# Patient Record
Sex: Female | Born: 1975 | Hispanic: Yes | Marital: Married | Smoking: Never smoker
Health system: Southern US, Community
[De-identification: ages and names within clinical notes are randomized; demographics above are authoritative.]

## PROBLEM LIST (undated history)

## (undated) DIAGNOSIS — A6923 Arthritis due to Lyme disease: Secondary | ICD-10-CM

## (undated) DIAGNOSIS — M1120 Other chondrocalcinosis, unspecified site: Secondary | ICD-10-CM

## (undated) DIAGNOSIS — D259 Leiomyoma of uterus, unspecified: Secondary | ICD-10-CM

## (undated) DIAGNOSIS — Z8619 Personal history of other infectious and parasitic diseases: Secondary | ICD-10-CM

## (undated) DIAGNOSIS — R200 Anesthesia of skin: Secondary | ICD-10-CM

## (undated) DIAGNOSIS — K219 Gastro-esophageal reflux disease without esophagitis: Secondary | ICD-10-CM

## (undated) DIAGNOSIS — M2550 Pain in unspecified joint: Secondary | ICD-10-CM

## (undated) HISTORY — DX: Anesthesia of skin: R20.0

## (undated) HISTORY — DX: Gastro-esophageal reflux disease without esophagitis: K21.9

## (undated) HISTORY — DX: Leiomyoma of uterus, unspecified: D25.9

## (undated) HISTORY — PX: BREAST SURGERY: SHX581

## (undated) HISTORY — DX: Other chondrocalcinosis, unspecified site: M11.20

## (undated) HISTORY — DX: Pain in unspecified joint: M25.50

## (undated) HISTORY — PX: COLON SURGERY: SHX602

## (undated) HISTORY — DX: Personal history of other infectious and parasitic diseases: Z86.19

## (undated) HISTORY — DX: Arthritis due to Lyme disease: A69.23

## (undated) HISTORY — PX: TUBAL LIGATION: SHX77

---

## 2001-12-08 ENCOUNTER — Encounter: Admission: RE | Admit: 2001-12-08 | Discharge: 2001-12-08 | Payer: Self-pay | Admitting: Specialist

## 2001-12-08 ENCOUNTER — Encounter: Payer: Self-pay | Admitting: Specialist

## 2018-03-05 DIAGNOSIS — N939 Abnormal uterine and vaginal bleeding, unspecified: Secondary | ICD-10-CM | POA: Insufficient documentation

## 2018-03-05 DIAGNOSIS — R87619 Unspecified abnormal cytological findings in specimens from cervix uteri: Secondary | ICD-10-CM | POA: Insufficient documentation

## 2018-03-05 HISTORY — DX: Abnormal uterine and vaginal bleeding, unspecified: N93.9

## 2018-03-05 HISTORY — DX: Unspecified abnormal cytological findings in specimens from cervix uteri: R87.619

## 2018-05-19 ENCOUNTER — Encounter: Payer: Self-pay | Admitting: Internal Medicine

## 2018-05-19 ENCOUNTER — Other Ambulatory Visit: Payer: Self-pay

## 2018-05-19 ENCOUNTER — Ambulatory Visit (INDEPENDENT_AMBULATORY_CARE_PROVIDER_SITE_OTHER): Payer: Commercial Managed Care - PPO | Admitting: Internal Medicine

## 2018-05-19 DIAGNOSIS — M5431 Sciatica, right side: Secondary | ICD-10-CM | POA: Insufficient documentation

## 2018-05-19 DIAGNOSIS — M1990 Unspecified osteoarthritis, unspecified site: Secondary | ICD-10-CM | POA: Insufficient documentation

## 2018-05-19 HISTORY — DX: Sciatica, right side: M54.31

## 2018-05-19 HISTORY — DX: Unspecified osteoarthritis, unspecified site: M19.90

## 2018-05-19 NOTE — Progress Notes (Signed)
[facility]      Reason for Consult: history of Lyme disease    Referring Physician: Dr. Aris Everts    Patient ID: Lisa Rivas, female    DOB: 1975-05-05, 43 y.o.   MRN: [id number]  HPI:   Here for evaluation of above.   She gives a history of being diagnosed with Lyme disease in 2012 by her primary care doctor when she presented with aches and joint complaints.  She states she had a positive blood test for Lyme disease at the time and took 3 weeks of doxycycline.  She remembers completing the medication and symptoms resolved.  She then had a follow up blood test by her report and the Lyme test was negative.  No record of this available to me.  She had not been to a Lyme-endemic area, had no EM rash, no meningitis, was not associated with a viral illness- like syndrome and resolved.  She then felt she had been fine since that time but recently has had more right shoulder pain, right leg pain and pain of her right foot.  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  She has been wearing heavy steel-toes boots as well.  She was sent here by Dr. Lin Landsman at her PCPs office with these complaints and to consider Lyme disease.  She gets relief of her symptoms with arthritis medication and had relief of her right leg with a steroid injection.   She was concerned of having to be treated for Lyme disease and heard about prolonged IV therapies and multiple medications people undergo for "chronic Lyme disease". Previous record reviewed in [place].  Saw rheumatology in 2016 and considered RA.    PMH: Hx of Lyme disease; hx of uterine fibroid  Prior to Admission medications   Not on File    No Known Allergies  Social History   Tobacco Use  . Smoking status: Never Smoker  . Smokeless tobacco: Never Used  Substance Use Topics  . Alcohol use: Not on file  . Drug use: Not on file  Married    [facility]: arthritis  Review of Systems  Constitutional: negative for fevers, chills, anorexia and weight loss Gastrointestinal: negative for nausea and diarrhea Musculoskeletal: negative for large joint edema Neurological: negative for headaches and dizziness Behavioral/Psych: negative for anxiety All other systems reviewed and are negative    Constitutional: in no apparent distress and alert  EYES: anicteric ENMT: no thrush Cardiovascular: Cor RRR Respiratory: CTA B; normal respiratory effort Musculoskeletal: full ROM of shoulder, hip, knee; no joint swelling Skin: negatives: no rash Neuro: non-focal  Labs: No results found for: WBC, HGB, HCT, MCV, PLT No results found for: CREATININE, BUN, NA, K, CL, CO2 No results found for: ALT, AST, GGT, ALKPHOS, BILITOT, INR   Assessment: 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  I discussed Lyme disease and its natural history, diagnosis, symptoms and treatment.  I am not clear why she was diagnosed with Lyme disease in 2012 since I do not have records but by her report she had no symptoms to suggest Lyme disease and had not been in an endemic area.  Additionally, she reportedly had convalescent serum that was negative suggesting she never had it.  Her only symptoms at the time were non-specific.  I assured her that she does not have any evidence for Lyme disease and long-term treatments  regardless are baseless and have no role in patients with confirmed Lyme disease.      By history, her should sounds like an overuse issue with her work, potentially her foot and right leg pain exacerbated by heavy steel-toed shoes.  It is improved with rest and anti-inflammatory medication and she may have an element of underlying arthritis (?rheumatoid, osteoarthritis).  She may have sciatica and plantar fasciitis due to her work and I discussed stretching exercises for this.  I will defer further work up for arthritis to her PCP.  Plan: 1)  continue supportive care  Thank you for the referral All questions answered to her satisfaction

## 2019-03-10 DIAGNOSIS — N854 Malposition of uterus: Secondary | ICD-10-CM | POA: Insufficient documentation

## 2019-08-20 ENCOUNTER — Encounter: Payer: Self-pay | Admitting: Gastroenterology

## 2019-08-21 ENCOUNTER — Encounter: Payer: Self-pay | Admitting: Internal Medicine

## 2019-08-21 ENCOUNTER — Encounter: Payer: Self-pay | Admitting: Gastroenterology

## 2019-10-06 ENCOUNTER — Encounter: Payer: Self-pay | Admitting: *Deleted

## 2019-10-07 ENCOUNTER — Ambulatory Visit: Payer: 59 | Admitting: Diagnostic Neuroimaging

## 2019-10-14 ENCOUNTER — Ambulatory Visit: Payer: Commercial Managed Care - PPO | Admitting: Gastroenterology

## 2019-12-11 ENCOUNTER — Ambulatory Visit: Payer: Self-pay | Admitting: Gastroenterology

## 2019-12-14 ENCOUNTER — Encounter: Payer: Self-pay | Admitting: Diagnostic Neuroimaging

## 2019-12-14 ENCOUNTER — Ambulatory Visit: Payer: 59 | Admitting: Diagnostic Neuroimaging

## 2019-12-14 VITALS — BP 112/75 | HR 78 | Ht 68.0 in | Wt 218.8 lb

## 2019-12-14 DIAGNOSIS — R531 Weakness: Secondary | ICD-10-CM | POA: Diagnosis not present

## 2019-12-14 DIAGNOSIS — R519 Headache, unspecified: Secondary | ICD-10-CM | POA: Diagnosis not present

## 2019-12-14 NOTE — Progress Notes (Signed)
[facility]  PATIENT: Lisa Rivas DOB: [birth year]  REFERRING CLINICIAN: Ronita Hipps, MD HISTORY FROM: patient and husband  REASON FOR VISIT: new consult    HISTORICAL  CHIEF COMPLAINT:  Chief Complaint  Patient presents with  . Facial numbness    rm 7 New Pt husband- Kittie Plater "not sure she has Lyme disease, numbness on left side of face and left arm/hand when I lay on that side"    HISTORY OF PRESENT ILLNESS:   44 year old female here for evaluation of left facial pain and numbness.  Symptoms started 6 months ago.  She describes intermittent pain and numbness in the left side of her face.  Symptoms can last minutes or hours at a time.  Sometimes her left arm falls asleep.  Sometimes she has left leg pain and numbness.  Attacks happen at least 2 times per week.  Patient taken Tylenol or Motrin with mild relief.  Patient has chronic arthritis pain.  She was previously evaluated by rheumatology.  She was previously diagnosed with Lyme disease in 2012 when she was living in [place] in [place].  Patient also had concussion at age 60 years old when she tripped and hit her head.  Patient also had Covid infection in November 2020.   REVIEW OF SYSTEMS: Full 14 system review of systems performed and negative with exception of: As per HPI.  ALLERGIES: No Known Allergies  HOME MEDICATIONS: Outpatient Medications Prior to Visit  Medication Sig Dispense Refill  . Cholecalciferol (D-3-5) 125 MCG (5000 UT) capsule Take 5,000 Units by mouth daily.    Marland Kitchen UNABLE TO FIND Med Name: probiotics     No facility-administered medications prior to visit.    PAST MEDICAL HISTORY: Past Medical History:  Diagnosis Date  . Acid reflux   . Facial numbness   . Fibroid uterus    h/o  . History of Lyme disease   . Joint pain     PAST SURGICAL HISTORY: Past Surgical History:  Procedure Laterality Date  . BREAST SURGERY    . COLON SURGERY    . TUBAL LIGATION       FAMILY HISTORY: No family history on file.  SOCIAL HISTORY: Social History   Socioeconomic History  . Marital status: Married    Spouse name: [place]  . Number of children: 3  . Years of education: HS in [place]  . Highest education level: Not on file  Occupational History  . Not on file  Tobacco Use  . Smoking status: Never Smoker  . Smokeless tobacco: Never Used  Substance and Sexual Activity  . Alcohol use: Not Currently  . Drug use: Not Currently  . Sexual activity: Not on file  Other Topics Concern  . Not on file  Social History Narrative   Lives with family   Caffeine coffee, 1 c daily   Social Determinants of Health   Financial Resource Strain:   . Difficulty of Paying Living Expenses: Not on file  Food Insecurity:   . Worried About Charity fundraiser in the Last Year: Not on file  . Ran Out of Food in the Last Year: Not on file  Transportation Needs:   . Lack of Transportation (Medical): Not on file  . Lack of Transportation (Non-Medical): Not on file  Physical Activity:   . Days of Exercise per Week: Not on file  . Minutes of Exercise per Session: Not on file  Stress:   . Feeling of Stress : Not on file  Social Connections:   . Frequency of Communication with Friends and Family: Not on file  . Frequency of Social Gatherings with Friends and Family: Not on file  . Attends Religious Services: Not on file  . Active Member of Clubs or Organizations: Not on file  . Attends Archivist Meetings: Not on file  . Marital Status: Not on file  Intimate Partner Violence:   . Fear of Current or Ex-Partner: Not on file  . Emotionally Abused: Not on file  . Physically Abused: Not on file  . Sexually Abused: Not on file     PHYSICAL EXAM  GENERAL EXAM/CONSTITUTIONAL: Vitals:  Vitals:   12/14/19 1118  BP: 112/75  Pulse: 78  Weight: 218 lb 12.8 oz (99.2 kg)  Height: 5\' 8"  (1.727 m)     Body mass index is 33.27 kg/m. Wt Readings from Last 3  Encounters:  12/14/19 218 lb 12.8 oz (99.2 kg)  05/19/18 204 lb (92.5 kg)     Patient is in no distress; well developed, nourished and groomed; neck is supple  CARDIOVASCULAR:  Examination of carotid arteries is normal; no carotid bruits  Regular rate and rhythm, no murmurs  Examination of peripheral vascular system by observation and palpation is normal  EYES:  Ophthalmoscopic exam of optic discs and posterior segments is normal; no papilledema or hemorrhages  No exam data present  MUSCULOSKELETAL:  Gait, strength, tone, movements noted in Neurologic exam below  NEUROLOGIC: MENTAL STATUS:  No flowsheet data found.  awake, alert, oriented to person, place and time  recent and remote memory intact  normal attention and concentration  language fluent, comprehension intact, naming intact  fund of knowledge appropriate  CRANIAL NERVE:   2nd - no papilledema on fundoscopic exam  2nd, 3rd, 4th, 6th - pupils equal and reactive to light, visual fields full to confrontation, extraocular muscles intact, no nystagmus  5th - facial sensation symmetric  7th - facial strength symmetric  8th - hearing intact  9th - palate elevates symmetrically, uvula midline  11th - shoulder shrug symmetric  12th - tongue protrusion midline  MOTOR:   normal bulk and tone, full strength in the BUE, BLE; EXCEPT DECR IN LEFT ARM AND LEFT LEG (FLUCTUATING)  SENSORY:   normal and symmetric to light touch, temperature, vibration  COORDINATION:   finger-nose-finger, fine finger movements normal  REFLEXES:   deep tendon reflexes present and symmetric  GAIT/STATION:   narrow based gait     DIAGNOSTIC DATA (LABS, IMAGING, TESTING) - I reviewed patient records, labs, notes, testing and imaging myself where available.  No results found for: WBC, HGB, HCT, MCV, PLT No results found for: NA, K, CL, CO2, GLUCOSE, BUN, CREATININE, CALCIUM, PROT, ALBUMIN, AST, ALT, ALKPHOS,  BILITOT, GFRNONAA, GFRAA No results found for: CHOL, HDL, LDLCALC, LDLDIRECT, TRIG, CHOLHDL No results found for: HGBA1C No results found for: VITAMINB12 No results found for: TSH     ASSESSMENT AND PLAN  44 y.o. year old female here with intermittent pain and numbness and weakness on left face, arm, leg, for past 6 months (summer 2021).   Dx:  1. Left-sided weakness   2. Left temporal headache      PLAN:  - check MRI brain with and without contrast; rule out vascular, demyelinating, autoimmune, inflammatory disease  Orders Placed This Encounter  Procedures  . MR BRAIN W WO CONTRAST   Return for pending if symptoms worsen or fail to improve.    Penni Bombard, MD  03/10/3359, 22:44 AM Certified in Neurology, Neurophysiology and [facility] [address], [place], [place] [address] [phone]

## 2019-12-15 ENCOUNTER — Telehealth: Payer: Self-pay | Admitting: Diagnostic Neuroimaging

## 2019-12-15 NOTE — Telephone Encounter (Signed)
[employer] Josem Kaufmann: [id number] (exp. 12/15/19 to 01/29/20) order sent to GI . They will reach out to the patient to schedule.

## 2020-01-04 ENCOUNTER — Ambulatory Visit
Admission: RE | Admit: 2020-01-04 | Discharge: 2020-01-04 | Disposition: A | Discharge status: Home / Self Care | Payer: 59 | Source: Ambulatory Visit | Attending: Diagnostic Neuroimaging | Admitting: Diagnostic Neuroimaging

## 2020-01-04 ENCOUNTER — Other Ambulatory Visit: Payer: Self-pay

## 2020-01-04 DIAGNOSIS — R531 Weakness: Secondary | ICD-10-CM

## 2020-01-04 DIAGNOSIS — R519 Headache, unspecified: Secondary | ICD-10-CM

## 2020-01-04 MED ORDER — GADOBENATE DIMEGLUMINE 529 MG/ML IV SOLN
20.0000 mL | Freq: Once | INTRAVENOUS | Status: AC | PRN
  Administered 2020-01-04: 20 mL via INTRAVENOUS

## 2020-01-11 ENCOUNTER — Telehealth: Payer: Self-pay | Admitting: *Deleted

## 2020-01-11 NOTE — Telephone Encounter (Signed)
LVM requesting call back for MRI results. 

## 2020-01-11 NOTE — Telephone Encounter (Signed)
Patient returned call and informed MRI brain was a slightly abnormal study (small spot; possible prior inflammation). Dr Cletis Athens recommends addional testing (MRI cervical and thoracic spine). Then may consider add'l lab testing. Advised she'll get a call after insurance approval is done. Patient verbalized understanding, appreciation.

## 2020-01-14 ENCOUNTER — Telehealth: Payer: Self-pay | Admitting: *Deleted

## 2020-01-14 DIAGNOSIS — R519 Headache, unspecified: Secondary | ICD-10-CM

## 2020-01-14 DIAGNOSIS — R9089 Other abnormal findings on diagnostic imaging of central nervous system: Secondary | ICD-10-CM

## 2020-01-14 DIAGNOSIS — R531 Weakness: Secondary | ICD-10-CM

## 2020-01-14 NOTE — Telephone Encounter (Signed)
Called patient and informed her MRI brain results showed a small abnormal lesion; Dr Leta Baptist stated she needs addional workup (MRI cervical and thoracic spine (with and without contrast).  Answered her questions to her stated satisfaction. She agrees to additional MRI scans, understands she'll get a call to schedule them after insurance approves,  verbalized understanding, appreciation. MRI orders placed.

## 2020-01-18 ENCOUNTER — Telehealth: Payer: Self-pay | Admitting: Diagnostic Neuroimaging

## 2020-01-18 NOTE — Telephone Encounter (Signed)
[employer] auth for the Cervical spine [id number] (exp. 01/18/20 to 03/03/20)  MRI Thoracic is pending. I uploaded notes.   Patient is scheduled at GI for 02/10/19.

## 2020-01-21 NOTE — Telephone Encounter (Signed)
[facility] for Thoracic: [phone] (exp. 01/21/20 to 03/06/20)

## 2020-02-10 ENCOUNTER — Ambulatory Visit
Admission: RE | Admit: 2020-02-10 | Discharge: 2020-02-10 | Disposition: A | Discharge status: Home / Self Care | Payer: 59 | Source: Ambulatory Visit | Attending: Diagnostic Neuroimaging | Admitting: Diagnostic Neuroimaging

## 2020-02-10 ENCOUNTER — Other Ambulatory Visit: Payer: Self-pay

## 2020-02-10 DIAGNOSIS — R519 Headache, unspecified: Secondary | ICD-10-CM

## 2020-02-10 DIAGNOSIS — R531 Weakness: Secondary | ICD-10-CM | POA: Diagnosis not present

## 2020-02-10 DIAGNOSIS — R9089 Other abnormal findings on diagnostic imaging of central nervous system: Secondary | ICD-10-CM

## 2020-02-10 MED ORDER — GADOBENATE DIMEGLUMINE 529 MG/ML IV SOLN
20.0000 mL | Freq: Once | INTRAVENOUS | Status: AC | PRN
  Administered 2020-02-10: 20 mL via INTRAVENOUS

## 2020-03-03 ENCOUNTER — Telehealth: Payer: Self-pay | Admitting: Diagnostic Neuroimaging

## 2020-03-03 NOTE — Telephone Encounter (Signed)
Called husband and LVM advising her MRI cervical spine is normal and shows stable brian lesion. Her  MRI thoracic spine is normal.  Left # for questions.

## 2020-03-03 NOTE — Telephone Encounter (Signed)
Pt's husband called wanting to know when the MRI results for 02/10/20 will be coming in. Please advise.

## 2020-06-08 ENCOUNTER — Encounter: Payer: Self-pay | Admitting: Nurse Practitioner

## 2020-06-13 ENCOUNTER — Emergency Department (HOSPITAL_COMMUNITY): Payer: 59

## 2020-06-13 ENCOUNTER — Other Ambulatory Visit: Payer: Self-pay

## 2020-06-13 ENCOUNTER — Emergency Department (HOSPITAL_COMMUNITY)
Admission: EM | Admit: 2020-06-13 | Discharge: 2020-06-13 | Disposition: A | Discharge status: Home / Self Care | Payer: 59 | Attending: Emergency Medicine | Admitting: Emergency Medicine

## 2020-06-13 DIAGNOSIS — R1013 Epigastric pain: Secondary | ICD-10-CM | POA: Diagnosis present

## 2020-06-13 DIAGNOSIS — R110 Nausea: Secondary | ICD-10-CM | POA: Insufficient documentation

## 2020-06-13 DIAGNOSIS — K219 Gastro-esophageal reflux disease without esophagitis: Secondary | ICD-10-CM | POA: Diagnosis not present

## 2020-06-13 LAB — COMPREHENSIVE METABOLIC PANEL
ALT: 18 U/L (ref 0–44)
AST: 19 U/L (ref 15–41)
Albumin: 4 g/dL (ref 3.5–5.0)
Alkaline Phosphatase: 74 U/L (ref 38–126)
Anion gap: 7 (ref 5–15)
BUN: 9 mg/dL (ref 6–20)
CO2: 22 mmol/L (ref 22–32)
Calcium: 8.8 mg/dL — ABNORMAL LOW (ref 8.9–10.3)
Chloride: 107 mmol/L (ref 98–111)
Creatinine, Ser: 0.56 mg/dL (ref 0.44–1.00)
GFR, Estimated: >60 mL/min (ref >60)
Glucose, Bld: 90 mg/dL (ref 70–99)
Potassium: 3.8 mmol/L (ref 3.5–5.1)
Sodium: 136 mmol/L (ref 135–145)
Total Bilirubin: 0.3 mg/dL (ref 0.3–1.2)
Total Protein: 6.9 g/dL (ref 6.5–8.1)

## 2020-06-13 LAB — CBC
HCT: 40.7 % (ref 36.0–46.0)
Hemoglobin: 13.5 g/dL (ref 12.0–15.0)
MCH: 30.5 pg (ref 26.0–34.0)
MCHC: 33.2 g/dL (ref 30.0–36.0)
MCV: 91.9 fL (ref 80.0–100.0)
Platelets: 267 10*3/uL (ref 150–400)
RBC: 4.43 MIL/uL (ref 3.87–5.11)
RDW: 12.8 % (ref 11.5–15.5)
WBC: 5.6 10*3/uL (ref 4.0–10.5)
nRBC: 0 % (ref 0.0–0.2)

## 2020-06-13 LAB — URINALYSIS, ROUTINE W REFLEX MICROSCOPIC
Bilirubin Urine: NEGATIVE
Glucose, UA: NEGATIVE mg/dL
Hgb urine dipstick: NEGATIVE
Ketones, ur: NEGATIVE mg/dL
Nitrite: NEGATIVE
Protein, ur: NEGATIVE mg/dL
Specific Gravity, Urine: 1.01 (ref 1.005–1.030)
pH: 8 (ref 5.0–8.0)

## 2020-06-13 LAB — I-STAT BETA HCG BLOOD, ED (MC, WL, AP ONLY): I-stat hCG, quantitative: <5 m[IU]/mL (ref <5)

## 2020-06-13 LAB — LIPASE, BLOOD: Lipase: 31 U/L (ref 11–51)

## 2020-06-13 MED ORDER — ALUMINUM-MAGNESIUM-SIMETHICONE 200-200-20 MG/5ML PO SUSP: 30.0000 mL | Freq: Three times a day (TID) | ORAL | 0 refills | Status: DC

## 2020-06-13 MED ORDER — MORPHINE SULFATE (PF) 4 MG/ML IV SOLN
4.0000 mg | Freq: Once | INTRAVENOUS | Status: AC
  Administered 2020-06-13: 4 mg via INTRAVENOUS
  Filled 2020-06-13: qty 1

## 2020-06-13 MED ORDER — ALUM & MAG HYDROXIDE-SIMETH 200-200-20 MG/5ML PO SUSP
30.0000 mL | Freq: Once | ORAL | Status: AC
  Administered 2020-06-13: 30 mL via ORAL
  Filled 2020-06-13: qty 30

## 2020-06-13 MED ORDER — LIDOCAINE VISCOUS HCL 2 % MT SOLN
15.0000 mL | Freq: Once | OROMUCOSAL | Status: AC
  Administered 2020-06-13: 15 mL via ORAL
  Filled 2020-06-13: qty 15

## 2020-06-13 MED ORDER — AMIODARONE HCL IN DEXTROSE 360-4.14 MG/200ML-% IV SOLN
INTRAVENOUS | Status: AC
  Filled 2020-06-13: qty 200

## 2020-06-13 MED ORDER — ONDANSETRON HCL 4 MG/2ML IJ SOLN
4.0000 mg | Freq: Once | INTRAMUSCULAR | Status: AC
  Administered 2020-06-13: 4 mg via INTRAVENOUS
  Filled 2020-06-13: qty 2

## 2020-06-13 MED ORDER — SUCRALFATE 1 G PO TABS: 1.0000 g | ORAL_TABLET | Freq: Three times a day (TID) | ORAL | 0 refills | Status: DC

## 2020-06-13 NOTE — ED Provider Notes (Signed)
[facility] Provider Note   CSN: [id number] Arrival date & time: 06/13/20  7510     History Chief Complaint  Patient presents with  . Abdominal Pain    Lisa Rivas is a 45 y.o. female with past medical history significant for acid reflux presents for evaluation of epigastric pain.  Patient states pain has been constant x1 month however worse over the last 2 days.  She was started on PPI by her PCP.  Has had some intermittent nausea without emesis.  Does not radiate into her back.  She denies fever, chills, nausea, vomiting, chest pain, shortness of breath, hemoptysis, right upper quadrant pain, dysuria, hematuria, melena, bright blood per rectum, diarrhea or constipation.  She denies any additional aggravating or alleviating factors.  She is not currently followed by GI provider.  Pain is worse with food intake.  States she has stopped drinking caffeine as well as greasy foods.  She denies any prior history of abdominal surgeries.  Denies additional aggravating or alleviating factors. Rate pain a 6/10. Described pain as burning.  History obtained from patient past medical records.  [place] interpreter, request to use husband as interpreter  HPI     Past Medical History:  Diagnosis Date  . Acid reflux   . Facial numbness   . Fibroid uterus    h/o  . History of Lyme disease   . Joint pain     Patient Active Problem List   Diagnosis Date Noted  . Sciatica of right side 05/19/2018  . Arthritis 05/19/2018    Past Surgical History:  Procedure Laterality Date  . BREAST SURGERY    . COLON SURGERY    . TUBAL LIGATION       OB History   No obstetric history on file.     No family history on file.  Social History   Tobacco Use  . Smoking status: Never Smoker  . Smokeless tobacco: Never Used  Substance Use Topics  . Alcohol use: Not Currently  . Drug use: Not Currently    Home Medications Prior to Admission  medications   Medication Sig Start Date End Date Taking? Authorizing Provider  aluminum-magnesium hydroxide-simethicone (MAALOX) [phone] MG/5ML SUSP Take 30 mLs by mouth 4 (four) times daily -  before meals and at bedtime. 06/13/20  Yes Selda Jalbert A, PA-C  sucralfate (CARAFATE) 1 g tablet Take 1 tablet (1 g total) by mouth 4 (four) times daily -  with meals and at bedtime. 06/13/20 07/13/20 Yes Rimsha Trembley A, PA-C  Cholecalciferol (D-3-5) 125 MCG (5000 UT) capsule Take 5,000 Units by mouth daily.    [provider]  UNABLE TO FIND Med Name: probiotics    [provider]    Allergies    Patient has no known allergies.  Review of Systems   Review of Systems  Constitutional: Negative.   HENT: Negative.   Respiratory: Negative.   Cardiovascular: Negative.   Gastrointestinal: Positive for abdominal pain and nausea. Negative for abdominal distention, anal bleeding, blood in stool, constipation, diarrhea, rectal pain and vomiting.  Genitourinary: Negative.   Musculoskeletal: Negative.   Skin: Negative.   Neurological: Negative.   All other systems reviewed and are negative.   Physical Exam Updated Vital Signs BP 111/68   Pulse 67   Temp 98.9 F (37.2 C) (Oral)   Resp 18   Ht 5\' 8"  (1.727 m)   Wt 99 kg   SpO2 98%   BMI 33.19  kg/m   Physical Exam Vitals and nursing note reviewed.  Constitutional:      General: She is not in acute distress.    Appearance: She is well-developed. She is not ill-appearing, toxic-appearing or diaphoretic.  HENT:     Head: Normocephalic and atraumatic.     Mouth/Throat:     Mouth: Mucous membranes are moist.  Eyes:     Pupils: Pupils are equal, round, and reactive to light.  Cardiovascular:     Rate and Rhythm: Normal rate.     Heart sounds: Normal heart sounds.  Pulmonary:     Effort: Pulmonary effort is normal. No respiratory distress.     Breath sounds: Normal breath sounds.     Comments: Speaks in full sentences  without difficulty.  Lungs clear to auscultation bilaterally. Abdominal:     General: Bowel sounds are normal. There is no distension.     Palpations: Abdomen is soft.     Tenderness: There is abdominal tenderness in the epigastric area. There is no right CVA tenderness, left CVA tenderness, guarding or rebound. Negative signs include Murphy's sign and McBurney's sign.     Hernia: No hernia is present.     Comments: Soft, tenderness epigastric region.  No right upper quadrant tenderness.  Negative Murphy sign, and Burney point.  Normoactive bowel sounds.  No overlying skin changes to abdominal wall.  Musculoskeletal:        General: Normal range of motion.     Cervical back: Normal range of motion.  Skin:    General: Skin is warm and dry.     Capillary Refill: Capillary refill takes less than 2 seconds.  Neurological:     General: No focal deficit present.     Mental Status: She is alert and oriented to person, place, and time.     ED Results / Procedures / Treatments   Labs (all labs ordered are listed, but only abnormal results are displayed) Labs Reviewed  COMPREHENSIVE METABOLIC PANEL - Abnormal; Notable for the following components:      Result Value   Calcium 8.8 (*)    All other components within normal limits  URINALYSIS, ROUTINE W REFLEX MICROSCOPIC - Abnormal; Notable for the following components:   APPearance HAZY (*)    Leukocytes,Ua SMALL (*)    Bacteria, UA FEW (*)    All other components within normal limits  LIPASE, BLOOD  CBC  I-STAT BETA HCG BLOOD, ED ([facility], [facility], [facility] ONLY)    EKG EKG Interpretation  Date/Time:  Monday Jun 13 2020 09:20:17 EDT Ventricular Rate:  82 PR Interval:  142 QRS Duration: 72 QT Interval:  372 QTC Calculation: 434 R Axis:   41 Text Interpretation: Normal sinus rhythm Low voltage QRS Borderline ECG No previous ECGs available Confirmed by Theotis Burrow [phone]) on 06/13/2020 11:58:39 AM   Radiology US Abdomen Limited RUQ  (LIVER/GB)  Result Date: 06/13/2020 CLINICAL DATA:  One month of abdominal pain EXAM: ULTRASOUND ABDOMEN LIMITED RIGHT UPPER QUADRANT COMPARISON:  Jun 16, 2019. FINDINGS: Gallbladder: No gallstones or pericholecystic fluid visualized. No sonographic Murphy sign noted by sonographer. Common bile duct: Diameter: 2.5 Liver: No focal lesion identified. Diffusely increased parenchymal echogenicity. Portal vein is patent on color Doppler imaging with normal direction of blood flow towards the liver. Other: None. IMPRESSION: The echogenicity of the liver is increased. This is a nonspecific finding but is most commonly seen with fatty infiltration of the liver. There are no obvious focal liver lesions identified. Electronically Signed  By: Dahlia Bailiff MD   On: 06/13/2020 14:02    Procedures Procedures   Medications Ordered in ED Medications  alum & mag hydroxide-simeth (MAALOX/MYLANTA) 200-200-20 MG/5ML suspension 30 mL (30 mLs Oral Given 06/13/20 1106)    And  lidocaine (XYLOCAINE) 2 % viscous mouth solution 15 mL (15 mLs Oral Given 06/13/20 1106)  morphine 4 MG/ML injection 4 mg (4 mg Intravenous Given 06/13/20 1359)  ondansetron (ZOFRAN) injection 4 mg (4 mg Intravenous Given 06/13/20 1358)    ED Course  I have reviewed the triage vital signs and the nursing notes.  Pertinent labs & imaging results that were available during my care of the patient were reviewed by me and considered in my medical decision making (see chart for details).  45 year old here for evaluation epigastric pain which is been ongoing x1 month.  She is afebrile, nonseptic, non-ill-appearing.  Describes pain as burning.  Worse with food intake.  Placed on PPI by PCP.  Has had nausea without emesis.  Heart and lungs are clear.  Her abdomen is tender to epigastric region however no upper right quadrant tenderness, negative Murphy sign.  She denies any NSAID use, EtOH use, history of pancreatitis or ulcers.  Denies any blood per rectum  or any melanotic stool.  No chest pain, shortness of breath to suggest atypical cardiac etiology.  Without tachycardia, tachypnea or hypoxia.  We will plan on labs, GI cocktail and reassess.  Labs and imaging personally reviewed and interpreted:  CBC without leukocytosis Preg negative CMP without electrolyte, renal or liver abnormality Lipase 31 UA negative for infection EKG without ischemic changes  Patient reassessed. Pain improved, however still has pain.  Is pointing out to the epigastric is also mild right upper quadrant tenderness however negative Murphy sign.  We will plan on ultrasound  Ultrasound likely fatty liver however no gallstones, evidence of acute cholecystitis. Tolerating PO intake without difficulty.  Patient symptoms likely due to reflux.  I have added Carafate, Maalox to her PPI that she is already taking.  I did review computer she has appointment in less than 2 weeks to see [facility].  Repeat benign abdominal exam I have low suspicion for perforated viscus at this time.  Patient is nontoxic, nonseptic appearing, in no apparent distress.  Patient's pain and other symptoms adequately managed in emergency department.  Fluid bolus given.  Labs, imaging and vitals reviewed.  Patient does not meet the SIRS or Sepsis criteria.  On repeat exam patient does not have a surgical abdomin and there are no peritoneal signs.  No indication of appendicitis, bowel obstruction, bowel perforation, cholecystitis, diverticulitis.  The patient has been appropriately medically screened and/or stabilized in the ED. I have low suspicion for any other emergent medical condition which would require further screening, evaluation or treatment in the ED or require inpatient management.  Patient is hemodynamically stable and in no acute distress.  Patient able to ambulate in department prior to ED.  Evaluation does not show acute pathology that would require ongoing or additional emergent interventions  while in the emergency department or further inpatient treatment.  I have discussed the diagnosis with the patient and answered all questions.  Pain is been managed while in the emergency department and patient has no further complaints prior to discharge.  Patient is comfortable with plan discussed in room and is stable for discharge at this time.  I have discussed strict return precautions for returning to the emergency department.  Patient was encouraged to  follow-up with PCP/specialist refer to at discharge.      MDM Rules/Calculators/A&P                           Final Clinical Impression(s) / ED Diagnoses Final diagnoses:  Epigastric abdominal pain    Rx / DC Orders ED Discharge Orders         Ordered    sucralfate (CARAFATE) 1 g tablet  3 times daily with meals & bedtime        06/13/20 1423    aluminum-magnesium hydroxide-simethicone (MAALOX) [phone] MG/5ML SUSP  3 times daily before meals & bedtime        06/13/20 1423           Idona Stach A, PA-C 06/13/20 1432    Little, Wenda Overland, MD 06/13/20 2027

## 2020-06-13 NOTE — ED Notes (Signed)
Pt transported to Ultrasound.  

## 2020-06-13 NOTE — Discharge Instructions (Signed)
Tome el medicamento segn lo prescrito.  Seguimiento con Financial risk analyst por sntomas nuevos o que [employer]

## 2020-06-13 NOTE — ED Notes (Signed)
Pt returned from ultrasound

## 2020-06-13 NOTE — ED Triage Notes (Signed)
Pt presents to the ED with epigastric pain for a month. Pt has an appointment at the end of the month is having too severe of epigastric pain. Denies nausea or vomiting.

## 2020-06-29 ENCOUNTER — Ambulatory Visit (INDEPENDENT_AMBULATORY_CARE_PROVIDER_SITE_OTHER): Payer: 59 | Admitting: Nurse Practitioner

## 2020-06-29 ENCOUNTER — Encounter: Payer: Self-pay | Admitting: Nurse Practitioner

## 2020-06-29 VITALS — BP 112/72 | HR 58 | Ht 68.0 in | Wt 200.0 lb

## 2020-06-29 DIAGNOSIS — K219 Gastro-esophageal reflux disease without esophagitis: Secondary | ICD-10-CM | POA: Diagnosis not present

## 2020-06-29 DIAGNOSIS — R1013 Epigastric pain: Secondary | ICD-10-CM | POA: Diagnosis not present

## 2020-06-29 MED ORDER — PANTOPRAZOLE SODIUM 40 MG PO TBEC: 40.0000 mg | DELAYED_RELEASE_TABLET | Freq: Every day | ORAL | 5 refills | Status: DC

## 2020-06-29 NOTE — Patient Instructions (Signed)
If you are age 44 or older, your body mass index should be between 23-30. Your Body mass index is 30.41 kg/m. If this is out of the aforementioned range listed, please consider follow up with your Primary Care Provider.  If you are age 63 or younger, your body mass index should be between 19-25. Your Body mass index is 30.41 kg/m. If this is out of the aformentioned range listed, please consider follow up with your Primary Care Provider.   Your provider has requested that you go to the basement level for lab work before leaving today. Press "B" on the elevator. The lab is located at the first door on the left as you exit the elevator  You have been scheduled for an endoscopy. Please follow written instructions given to you at your visit today. If you use inhalers (even only as needed), please bring them with you on the day of your procedure.  The [facility] providers would like to encourage you to use [facility] to communicate with providers for non-urgent requests or questions.  Due to long hold times on the telephone, sending your provider a message by [facility] may be a faster and more efficient way to get a response.  Please allow 48 business hours for a response.  Please remember that this is for non-urgent requests.   Continue Carafate.    Conn's Current Therapy 2021 (pp. 213-216). [place], [place]: Elsevier.">  Enfermedad de reflujo gastroesofgico en los adultos Gastroesophageal Reflux Disease, Adult El reflujo gastroesofgico (RGE) ocurre cuando el cido del estmago sube por el tubo que conecta la boca con el estmago (esfago). Normalmente, la comida baja por el esfago y se mantiene en el estmago, donde se la digiere. Sin embargo, cuando una persona tiene [place], los alimentos y el cido estomacal suelen volver al esfago. Si esto se vuelve un problema ms grave, a la persona se le puede diagnosticar una enfermedad llamada enfermedad de reflujo gastroesofgico (ERGE). La ERGE ocurre cuando  el reflujo:  Sucede a menudo.  Causa sntomas frecuentes o graves.  Causa problemas tales como dao en el esfago. Cuando el cido del Insurance claims handler en contacto con el esfago, el cido puede provocar inflamacin en el esfago. Con el tiempo, pueden formarse pequeos agujeros (lceras) en el revestimiento del esfago. Cules son las causas? Esta afeccin se debe a un problema en el msculo que se encuentra entre el esfago y Product manager (esfnter esofgico inferior, o EEI). Normalmente, el EEI se cierra una vez que la comida pasa a travs del esfago hasta el [place]. Cuando el EEI se encuentra debilitado o tiene alguna anomala, no se cierra por completo, y eso permite que tanto la comida como el jugo gstrico, que es cido, [place] a subir por el esfago. El EEI puede debilitarse a causa de ciertas sustancias alimenticias, medicamentos y Product/process development scientist, que incluyen:  El consumo de [place].  [place].  Tener una hernia de hiato.  Consumo de alcohol.  Ciertos alimentos y bebidas, como caf, chocolate, cebollas y [place]. Qu incrementa el riesgo? Es ms probable que tenga esta afeccin si:  Tiene un aumento del Engineer, site.  Tiene un trastorno del tejido conjuntivo.  Toma antiinflamatorios no esteroideos (AINE), como el ibuprofeno. Cules son los signos o sntomas? Los sntomas de esta afeccin incluyen:  Merchant navy officer.  Dificultad o dolor para tragar o la sensacin de tener un bulto en la garganta.  Sabor amargo en la boca.  Mal aliento y [place] gran cantidad de saliva.  Estmago inflamado  o con Tree surgeon y eructos.  Dolor en el pecho. El dolor de pecho puede deberse a distintas afecciones. Es importante que consulte al mdico si tiene dolor de [place].  Dificultad para respirar o sibilancias.  Tos constante (crnica) o tos nocturna.  Desgaste del Paramedic.  Prdida de peso. Cmo se diagnostica? Esta afeccin se puede diagnosticar en funcin de los  antecedentes mdicos y un examen fsico. Para determinar si tiene ERGE leve o grave, el mdico tambin puede controlar cmo usted reacciona al tratamiento. Tambin pueden Dillard's, que [place] los siguientes:  Un estudio para examinarle el [place] y el esfago con una cmara pequea (endoscopa).  Una prueba para medir el grado de Musician.  Una prueba para medir cunta presin hay en el esfago.  Un estudio de deglucin con bario comn o modificado para ver la forma, el tamao y el funcionamiento del esfago. Cmo se trata? El tratamiento de esta afeccin puede variar segn la gravedad de los sntomas. El mdico puede recomendarle lo siguiente:  Cambios en la dieta.  Medicamentos.  [place]. El [place] del tratamiento es ayudar a Public house manager los sntomas y Product/process development scientist las complicaciones. Siga estas instrucciones en su casa: Comida y bebida  Siga la dieta recomendada por el mdico. Esto puede incluir evitar ciertos alimentos y bebidas, por ejemplo: ? Caf y t negro, con o sin cafena. ? Bebidas que contengan alcohol. ? Bebidas energticas y deportivas. ? Bebidas gaseosas o refrescos. ? Chocolate y cacao. ? Menta y [place]. ? Ajo y cebolla. ? Rbano picante. ? Alimentos condimentados, picantes y cidos, por ejemplo, todos los tipos de pimientas, [place] en polvo, curry en polvo, vinagre, salsas picantes y [employer]. ? Ctricos y sus jugos, por ejemplo, naranjas, limones y limas. ? Alimentos a base de tomate, como salsa de [place], [place], salsa picante y pizza con salsa de [place]. ? Alimentos fritos y [place], [place] donas, papas fritas y aderezos ricos en grasas. ? Carnes con alto contenido de grasa, como salchichas, y cortes de carnes rojas y blancas con mucha grasa, por ejemplo, chuletas o costillas, embutidos, jamn y tocino. ? Productos lcteos ricos en grasas, como leche [place], [place] y [place] crema.  Haga comidas pequeas y frecuentes Doctor, hospital de comidas abundantes.  Evite beber grandes cantidades de lquidos con las comidas.  Evite comer 2 o 3horas antes de acostarse.  Evite recostarse inmediatamente despus de comer.  No haga ejercicios enseguida despus de comer.   Estilo de vida  No consuma ningn producto que contenga nicotina o tabaco. Estos productos incluyen cigarrillos, tabaco para Higher education careers adviser y aparatos de vapeo, como los Psychologist, sport and exercise. Si necesita ayuda para dejar de fumar, consulte al [place].  Trate de reducir el estrs con mtodos como el yoga o la meditacin. Si necesita ayuda para reducir [employer] de estrs, consulte al mdico.  Si tiene sobrepeso, baje hasta llegar a un peso saludable para usted. Pdale consejos al mdico para bajar de peso de [place] segura.   Instrucciones generales  Est atento a cualquier cambio en los sntomas.  Use los medicamentos de venta libre y los recetados solamente como se lo haya indicado el mdico. No tome aspirina, ibuprofeno ni otros antiinflamatorios no esteroideos (AINE) a menos que el mdico le haya indicado que tome estos medicamentos.  Use ropa suelta. No use nada apretado alrededor de la cintura que haga presin sobre el abdomen.  Levante (eleve) la cabecera de la cama aproximadamente 6pulgadas (15cm).  Para hacerlo puede usar una cua.  Evite inclinarse si al hacerlo empeoran los sntomas.  Cumpla con todas las visitas de seguimiento. Esto es importante. Comunquese con un mdico si:  Tiene los siguientes sntomas: ? Sntomas nuevos. ? Prdida de peso sin causa aparente. ? Dificultad o dolor al tragar. ? Sibilancias o una tos persistente. ? Voz ronca.  Los sntomas no mejoran con Dispensing optician. Solicite ayuda de inmediato si:  Tree surgeon repentino [employer], el cuello, la [place], los dientes o la espalda.  De repente se siente transpirado, mareado o aturdido.  Siente falta de aire o Tourist information centre manager.  Vomita y el vmito es de  color verde, amarillo o negro, o tiene un aspecto similar a la sangre o a los posos de caf.  Se desmaya.  Tiene heces rojas, sanguinolentas o negras.  No puede tragar, beber o comer. Estos sntomas pueden representar un problema grave que constituye Engineer, maintenance (IT). No espere a ver si los sntomas desaparecen. Solicite atencin mdica de inmediato. Comunquese con el servicio de emergencias de su localidad (911 en los [place]). No conduzca por sus propios medios Principal Financial. Resumen  El reflujo gastroesofgico ocurre cuando el cido del estmago sube al esfago. La ERGE es una enfermedad en la que el reflujo ocurre con frecuencia, causa sntomas frecuentes o graves, o causa problemas tales como dao en el esfago.  El tratamiento de esta afeccin puede variar segn la gravedad de los sntomas. El mdico puede indicarle que siga una dieta y haga cambios en su estilo de vida, tome medicamentos o se someta a [place].  Comunquese con un mdico si tiene sntomas nuevos o los sntomas empeoran.  Use los medicamentos de venta libre y los recetados solamente como se lo haya indicado el mdico. No tome aspirina, ibuprofeno ni otros antiinflamatorios no esteroideos (AINE) a menos que el mdico se lo indique.  Concurra a todas las visitas de seguimiento como se lo haya indicado el mdico. Esto es importante. Esta informacin no tiene Marine scientist el consejo del mdico. Asegrese de hacerle al mdico cualquier pregunta que tenga. Document Revised: 09/09/2019 Document Reviewed: 09/09/2019 Elsevier Patient Education  2021 [place].  Thank you for choosing me and [facility].  Tye Savoy, NP

## 2020-06-29 NOTE — Progress Notes (Signed)
ASSESSMENT AND PLAN    #45 year old female with GERD manifested as reflux and pyrosis.  Symptoms resolved after starting PPI late April.  -- Provided GERD literature in Spanish language  # Generalized upper abdominal burning.  Unremarkable labs and RUQ ultrasound in ED earlier this month .  Symptoms improved with PPI and Carafate but still symptomatic with consumption of certain foods.  We discussed giving this a little more time on PPI versus proceeding with upper endoscopy.  Patient would like to proceed with an upper endoscopy since she had the same symptoms last fall   --obtain  for H.pylori IgG --The risks and benefits of EGD with possible biopsies was discussed with the patient and they agree to proceed.    HISTORY OF PRESENT ILLNESS     Chief Complaint : upper abdominal pain   Lisa Rivas is a 45 y.o. female, mainly Spanish-speaking,  with a past medical history significant for GERD and Lyme's disease. See additional PMH below.   Patient is new to the practice, referred by PCP for evaluation of GERD.   *Patient's husband helps with interpretation /  translatation.   Patient complains of postprandial epigastric and LUQ pain. She had this same pain last fall and made an appointment with [place]. The pain resolved after starting pantoprazole so she cancelled the appointment. She stopped pantoprazole after about three months because she was feeling better.  She did fine until the pain returned sometime in March.  The nonradiating , generalized upper abdominal pain is burning in nature and is associated with nausea.  The patient has also been having some postprandial and nocturnal acid reflux .  She saw her PCP late April and was started on pantoprazole.  Following that she had resolution of acid reflux symptoms .  However her epigastric discomfort persisted . She went to ED on 5/9.  Her labs were unremarkable.  RUQ ultrasound unremarkable except for possible fatty liver.  She was  prescribed Carafate ac and HS. With the addition of Carafate her symptoms have improved but she still gets recurrent epigastric burning anytime she consumes spicy food, acidic foods or red meat. No NSAID use.    No lower Gi complaints. Bowel movements are normal. She has recently lost ~ 12 pounds because of dietary changes made for the upper abdominal pain.    Data Reviewed:  August 2021 TSH 1.4 A1c 5.10 Nov 2019 CBC normal Liver chemistries normal.   06/13/20 ED CBC normal Liver labs and lipase normal RUQ US showed possible fatty liver. No gallstones or gb wall thickening     Past Medical History:  Diagnosis Date  . Acid reflux   . Facial numbness   . Fibroid uterus    h/o  . History of Lyme disease   . Joint pain      Past Surgical History:  Procedure Laterality Date  . BREAST SURGERY    . COLON SURGERY    . TUBAL LIGATION     Family History  Problem Relation Age of Onset  . Diabetes Maternal Grandmother   . Diabetes Paternal Grandmother    Social History   Tobacco Use  . Smoking status: Never Smoker  . Smokeless tobacco: Never Used  Vaping Use  . Vaping Use: Never used  Substance Use Topics  . Alcohol use: Not Currently  . Drug use: Not Currently   Current Outpatient Medications  Medication Sig Dispense Refill  . Cholecalciferol (D-3-5) 125 MCG (5000 UT) capsule Take 5,000  Units by mouth daily.    . pantoprazole (PROTONIX) 40 MG tablet Take 40 mg by mouth daily.    . sucralfate (CARAFATE) 1 g tablet Take 1 tablet (1 g total) by mouth 4 (four) times daily -  with meals and at bedtime. 120 tablet 0   No current facility-administered medications for this visit.   No Known Allergies   Review of Systems: Positive for arthritis, back pain, menstrual pain, muscle pain cramps.  All other systems reviewed and negative except where noted in HPI.    PHYSICAL EXAM :    Wt Readings from Last 3 Encounters:  06/29/20 200 lb (90.7 kg)  06/13/20 218 lb 4.1 oz  (99 kg)  12/14/19 218 lb 12.8 oz (99.2 kg)    BP 112/72   Pulse (!) 58   Ht 5\' 8"  (1.727 m)   Wt 200 lb (90.7 kg)   BMI 30.41 kg/m  Constitutional:  Pleasant female in no acute distress. Psychiatric: Normal mood and affect. Behavior is normal. EENT: Pupils normal.  Conjunctivae are normal. No scleral icterus. Neck supple.  Cardiovascular: Normal rate, regular rhythm. No edema Pulmonary/chest: Effort normal and breath sounds normal. No wheezing, rales or rhonchi. Abdominal: Soft, nondistended, nontender. Bowel sounds active throughout. There are no masses palpable. No hepatomegaly. Neurological: Alert and oriented to person place and time. Skin: Skin is warm and dry. No rashes noted.  Tye Savoy, NP  06/29/2020, 2:50 PM  Cc:  Referring Provider Clydie Braun, FNP

## 2020-07-01 NOTE — Progress Notes (Signed)
Agree with the assessment and plan as outlined by Paula Guenther, NP. ° °Kamala Kolton, DO, FACG ° °

## 2020-07-05 ENCOUNTER — Other Ambulatory Visit: Payer: Self-pay

## 2020-07-05 ENCOUNTER — Ambulatory Visit (AMBULATORY_SURGERY_CENTER): Payer: 59 | Admitting: Gastroenterology

## 2020-07-05 ENCOUNTER — Encounter: Payer: Self-pay | Admitting: Gastroenterology

## 2020-07-05 VITALS — BP 111/71 | HR 60 | Temp 98.2°F | Resp 20 | Ht 68.0 in | Wt 200.0 lb

## 2020-07-05 DIAGNOSIS — R1013 Epigastric pain: Secondary | ICD-10-CM | POA: Diagnosis not present

## 2020-07-05 DIAGNOSIS — K317 Polyp of stomach and duodenum: Secondary | ICD-10-CM

## 2020-07-05 DIAGNOSIS — K219 Gastro-esophageal reflux disease without esophagitis: Secondary | ICD-10-CM

## 2020-07-05 MED ORDER — SODIUM CHLORIDE 0.9 % IV SOLN: 500.0000 mL | Freq: Once | INTRAVENOUS | Status: DC

## 2020-07-05 NOTE — Progress Notes (Signed)
pt tolerated well. VSS. awake and to recovery. Report given to RN. Bite block insitu to recovery. 

## 2020-07-05 NOTE — Op Note (Signed)
[place] Patient Name: Lisa Rivas Procedure Date: 07/05/2020 11:49 AM MRN: [id number] Endoscopist: Gerrit Heck , MD Age: 45 Referring MD:  Date of Birth: 14-Mar-1975 Gender: Female Account #: [id number] Procedure:                Upper GI endoscopy Indications:              Epigastric abdominal pain, Upper abdominal pain,                            Suspected esophageal reflux Medicines:                Monitored Anesthesia Care Procedure:                Pre-Anesthesia Assessment:                           - Prior to the procedure, a History and Physical                            was performed, and patient medications and                            allergies were reviewed. The patient's tolerance of                            previous anesthesia was also reviewed. The risks                            and benefits of the procedure and the sedation                            options and risks were discussed with the patient.                            All questions were answered, and informed consent                            was obtained. Prior Anticoagulants: The patient has                            taken no previous anticoagulant or antiplatelet                            agents. ASA Grade Assessment: II - A patient with                            mild systemic disease. After reviewing the risks                            and benefits, the patient was deemed in                            satisfactory condition to undergo the procedure.  After obtaining informed consent, the endoscope was                            passed under direct vision. Throughout the                            procedure, the patient's blood pressure, pulse, and                            oxygen saturations were monitored continuously. The                            Endoscope was introduced through the mouth, and                            advanced to the second part  of duodenum. The upper                            GI endoscopy was accomplished without difficulty.                            The patient tolerated the procedure well. Scope In: Scope Out: Findings:                 The examined esophagus was normal.                           The Z-line was regular and was found 39 cm from the                            incisors.                           A few small sessile polyps with no bleeding and no                            stigmata of recent bleeding were found in the                            gastric fundus and in the gastric body. These                            polyps were removed with a cold biopsy forceps.                            Resection and retrieval were complete. Estimated                            blood loss was minimal.                           Normal mucosa was found in the entire examined  stomach. Biopsies were taken with a cold forceps                            for Helicobacter pylori testing. Estimated blood                            loss was minimal.                           The examined duodenum was normal. Complications:            No immediate complications. Estimated Blood Loss:     Estimated blood loss was minimal. Impression:               - Normal esophagus.                           - Z-line regular, 39 cm from the incisors.                           - A few gastric polyps. Resected and retrieved.                           - Normal mucosa was found in the entire stomach.                            Biopsied.                           - Normal examined duodenum. Recommendation:           - Patient has a contact number available for                            emergencies. The signs and symptoms of potential                            delayed complications were discussed with the                            patient. Return to normal activities tomorrow.                            Written  discharge instructions were provided to the                            patient.                           - Resume previous diet.                           - Continue present medications.                           - Await pathology results.                           -  Return to GI clinic PRN. Gerrit Heck, MD 07/05/2020 12:12:14 PM

## 2020-07-05 NOTE — Progress Notes (Signed)
Vital signs checked by:CW  The medical and surgical history was reviewed and verified with the patient.  Interpreter used today at the [facility] for this pt.  Interpreter's name is-EdgarJimenez

## 2020-07-05 NOTE — Progress Notes (Signed)
Called to room to assist during endoscopic procedure.  Patient ID and intended procedure confirmed with present staff. Received instructions for my participation in the procedure from the performing physician.  

## 2020-07-05 NOTE — Patient Instructions (Signed)
Discharge instructions given. Biopsies taken. Resume previous medications. YOU HAD AN ENDOSCOPIC PROCEDURE TODAY AT [facility]:   Refer to the procedure report that was given to you for any specific questions about what was found during the examination.  If the procedure report does not answer your questions, please call your gastroenterologist to clarify.  If you requested that your care partner not be given the details of your procedure findings, then the procedure report has been included in a sealed envelope for you to review at your convenience later.  YOU SHOULD EXPECT: Some feelings of bloating in the abdomen. Passage of more gas than usual.  Walking can help get rid of the air that was put into your GI tract during the procedure and reduce the bloating. If you had a lower endoscopy (such as a colonoscopy or flexible sigmoidoscopy) you may notice spotting of blood in your stool or on the toilet paper. If you underwent a bowel prep for your procedure, you may not have a normal bowel movement for a few days.  Please Note:  You might notice some irritation and congestion in your nose or some drainage.  This is from the oxygen used during your procedure.  There is no need for concern and it should clear up in a day or so.  SYMPTOMS TO REPORT IMMEDIATELY:    Following upper endoscopy (EGD)  Vomiting of blood or coffee ground material  New chest pain or pain under the shoulder blades  Painful or persistently difficult swallowing  New shortness of breath  Fever of 100F or higher  Black, tarry-looking stools  For urgent or emergent issues, a gastroenterologist can be reached at any hour by calling [phone]. Do not use MyChart messaging for urgent concerns.    DIET:  We do recommend a small meal at first, but then you may proceed to your regular diet.  Drink plenty of fluids but you should avoid alcoholic beverages for 24 hours.  ACTIVITY:  You should plan to take it  easy for the rest of today and you should NOT DRIVE or use heavy machinery until tomorrow (because of the sedation medicines used during the test).    FOLLOW UP: Our staff will call the number listed on your records 48-72 hours following your procedure to check on you and address any questions or concerns that you may have regarding the information given to you following your procedure. If we do not reach you, we will leave a message.  We will attempt to reach you two times.  During this call, we will ask if you have developed any symptoms of COVID 19. If you develop any symptoms (ie: fever, flu-like symptoms, shortness of breath, cough etc.) before then, please call [phone].  If you test positive for Covid 19 in the 2 weeks post procedure, please call and report this information to [place].    If any biopsies were taken you will be contacted by phone or by letter within the next 1-3 weeks.  Please call us at [phone] if you have not heard about the biopsies in 3 weeks.    SIGNATURES/CONFIDENTIALITY: You and/or your care partner have signed paperwork which will be entered into your electronic medical record.  These signatures attest to the fact that that the information above on your After Visit Summary has been reviewed and is understood.  Full responsibility of the confidentiality of this discharge information lies with you and/or your care-partner.

## 2020-07-07 ENCOUNTER — Telehealth: Payer: Self-pay

## 2020-07-07 NOTE — Telephone Encounter (Signed)
  Follow up Call-  Call back number 07/05/2020  Post procedure Call Back phone  # [phone]  Permission to leave phone message Yes  Some recent data might be hidden     Patient questions:  Do you have a fever, pain , or abdominal swelling? No. Pain Score  0 *  Have you tolerated food without any problems? Yes.    Have you been able to return to your normal activities? Yes.    Do you have any questions about your discharge instructions: Diet   No. Medications  No. Follow up visit  No.  Do you have questions or concerns about your Care? No.  Actions: * If pain score is 4 or above: No action needed, pain <4. 1. Have you developed a fever since your procedure? no  2.   Have you had an respiratory symptoms (SOB or cough) since your procedure? no  3.   Have you tested positive for COVID 19 since your procedure no  4.   Have you had any family members/close contacts diagnosed with the COVID 19 since your procedure?  no   If yes to any of these questions please route to Joylene John, RN and Joella Prince, RN

## 2020-07-08 ENCOUNTER — Telehealth: Payer: Self-pay | Admitting: General Surgery

## 2020-07-08 NOTE — Telephone Encounter (Signed)
-----   Message from [place], DO sent at 07/08/2020  1:12 PM EDT ----- Please contact this patient via [place] interpreter to relay the results of the recent upper endoscopy as follows:  -The biopsies taken from your stomach were normal and there was no evidence of inflammation or Helicobacter pylori infection.  -The polyps resected from your stomach were benign fundic gland polyps.  There was no evidence of infection with Helicobacter pylori or intestinal metaplasia/dysplasia.  These types of polyps are typically secondary to acid suppression therapy and no specific follow-up required for these small, benign polyps.

## 2020-07-08 NOTE — Telephone Encounter (Signed)
[place] interpreter, Farley Ly contacted the patients mobile number and there was no answer. She left a voicemail to contact our office regarding her results.

## 2020-07-08 NOTE — Telephone Encounter (Signed)
-----   Message from [place], DO sent at 07/08/2020  1:12 PM EDT ----- Please contact this patient via [place] interpreter to relay the results of the recent upper endoscopy as follows:  -The biopsies taken from your stomach were normal and there was no evidence of inflammation or Helicobacter pylori infection.  -The polyps resected from your stomach were benign fundic gland polyps.  There was no evidence of infection with Helicobacter pylori or intestinal metaplasia/dysplasia.  These types of polyps are typically secondary to acid suppression therapy and no specific follow-up required for these small, benign polyps.

## 2020-07-11 NOTE — Telephone Encounter (Signed)
-----   Message from [place], DO sent at 07/08/2020  1:12 PM EDT ----- Please contact this patient via [place] interpreter to relay the results of the recent upper endoscopy as follows:  -The biopsies taken from your stomach were normal and there was no evidence of inflammation or Helicobacter pylori infection.  -The polyps resected from your stomach were benign fundic gland polyps.  There was no evidence of infection with Helicobacter pylori or intestinal metaplasia/dysplasia.  These types of polyps are typically secondary to acid suppression therapy and no specific follow-up required for these small, benign polyps.

## 2020-07-11 NOTE — Telephone Encounter (Signed)
Patient was given results to biopsies and understood the results. Is still having a lot of acid reflux, pt instructed to continue with her medications and start on reducing spicy foods and cutting back on carbonated beverages. Patient asked to come to the clinic to visit with Dr Bryan Lemma again, appt made for 08/26/2020

## 2020-08-09 ENCOUNTER — Other Ambulatory Visit: Payer: Self-pay

## 2020-08-09 MED ORDER — SUCRALFATE 1 G PO TABS: 1.0000 g | ORAL_TABLET | Freq: Three times a day (TID) | ORAL | 0 refills | Status: DC

## 2020-08-26 ENCOUNTER — Other Ambulatory Visit: Payer: Self-pay

## 2020-08-26 ENCOUNTER — Ambulatory Visit: Payer: 59 | Admitting: Gastroenterology

## 2021-06-13 DIAGNOSIS — Z1231 Encounter for screening mammogram for malignant neoplasm of breast: Secondary | ICD-10-CM | POA: Diagnosis not present

## 2021-06-13 DIAGNOSIS — D649 Anemia, unspecified: Secondary | ICD-10-CM | POA: Diagnosis not present

## 2021-06-13 DIAGNOSIS — Z01419 Encounter for gynecological examination (general) (routine) without abnormal findings: Secondary | ICD-10-CM | POA: Diagnosis not present

## 2021-06-13 DIAGNOSIS — Z6835 Body mass index (BMI) 35.0-35.9, adult: Secondary | ICD-10-CM | POA: Diagnosis not present

## 2021-06-13 DIAGNOSIS — N854 Malposition of uterus: Secondary | ICD-10-CM | POA: Diagnosis not present

## 2021-09-17 IMAGING — US US ABDOMEN LIMITED RUQ/ASCITES
1 series · 14 of 25 positions shown · non-contrast
Comparison: June 16, 2019.

CLINICAL DATA: One month of abdominal pain

EXAM:
ULTRASOUND ABDOMEN LIMITED RIGHT UPPER QUADRANT

[Series 1: us abdomen limited ruq (liver/gb) · 14 of 57 slices shown]
[im 1/57]
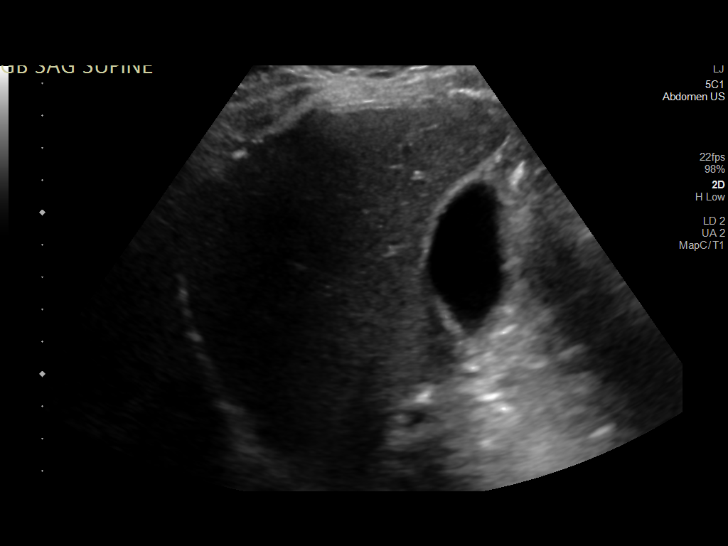
[im 5/57]
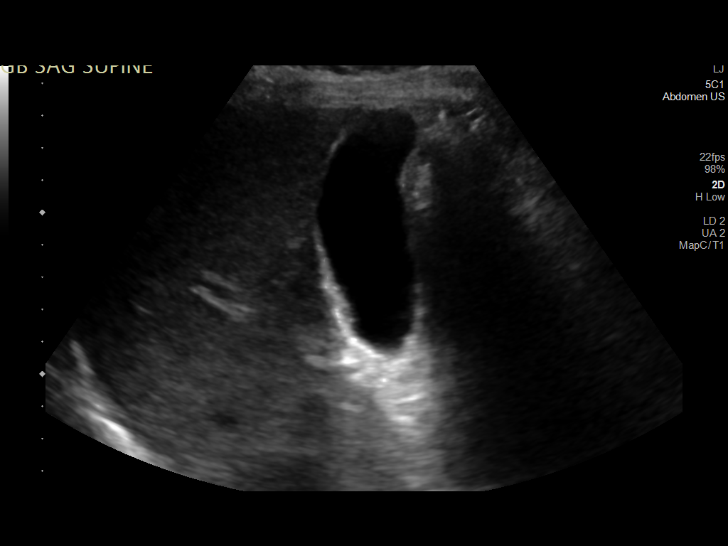
[im 10/57]
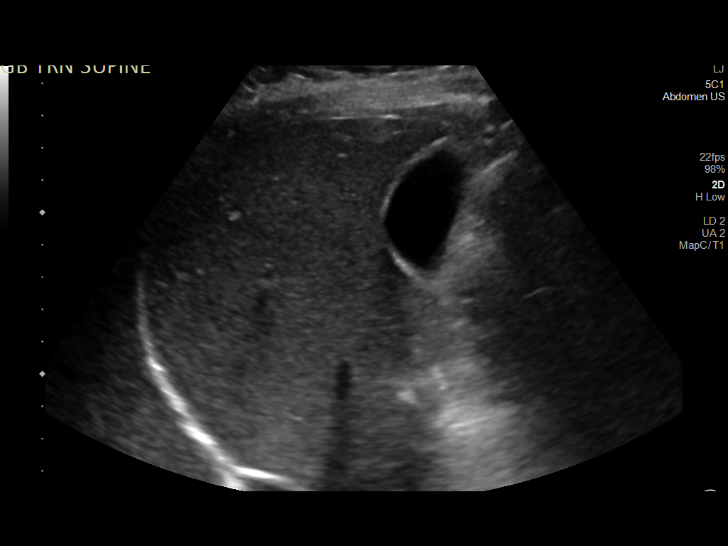
[im 15/57]
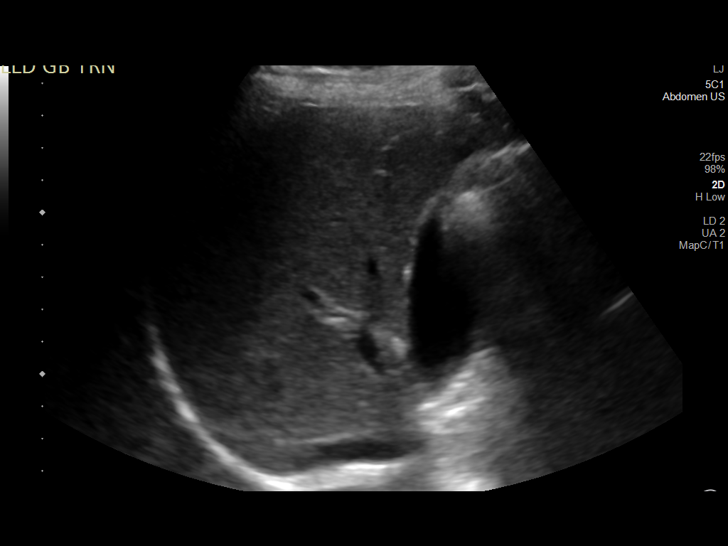
[im 19/57]
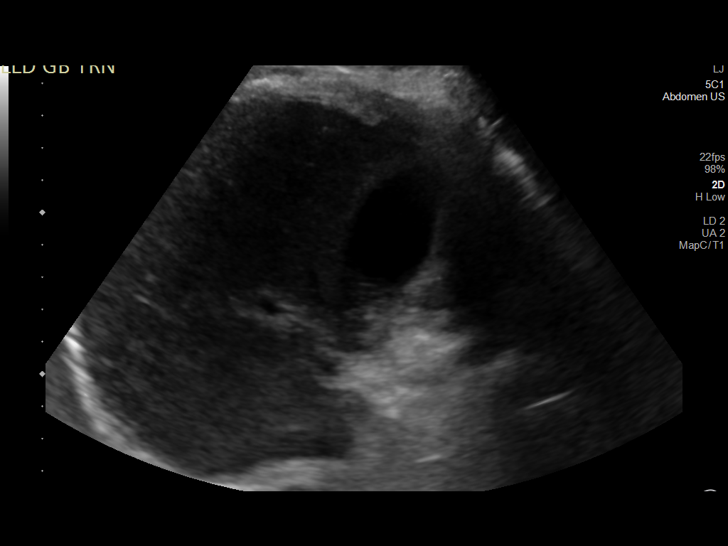
[im 22/57]
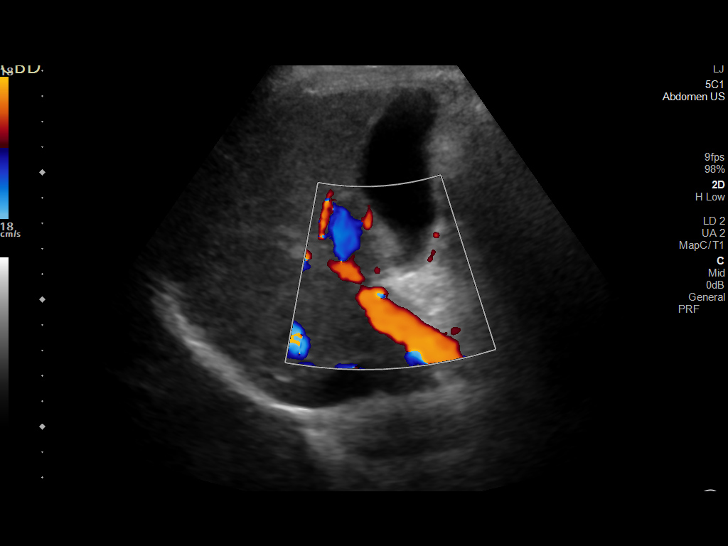
[im 26/57]
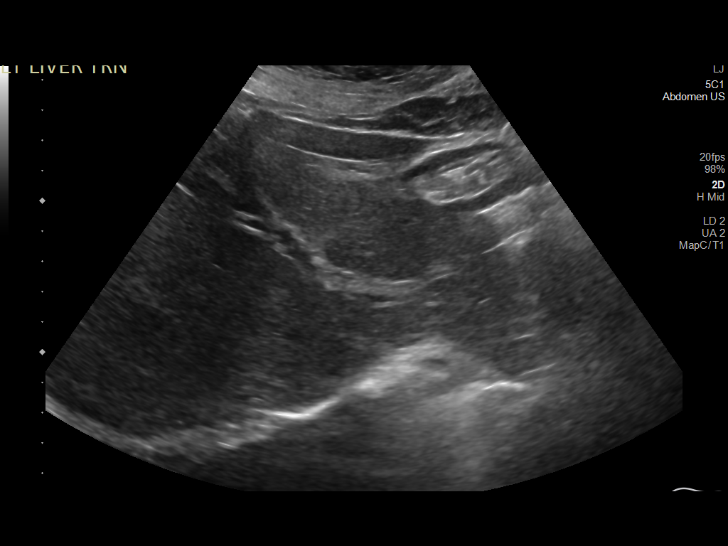
[im 31/57]
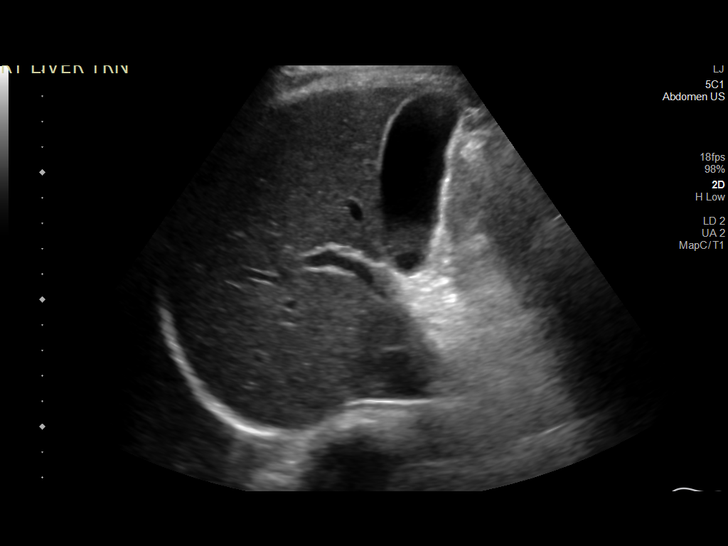
[im 36/57]
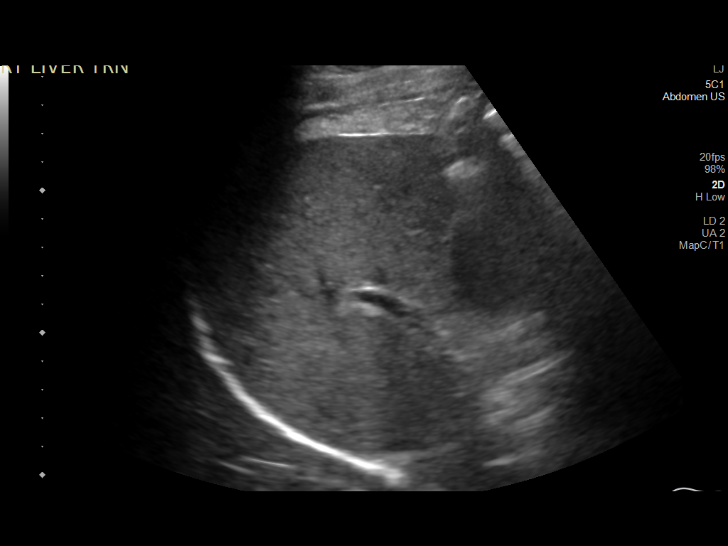
[im 38/57]
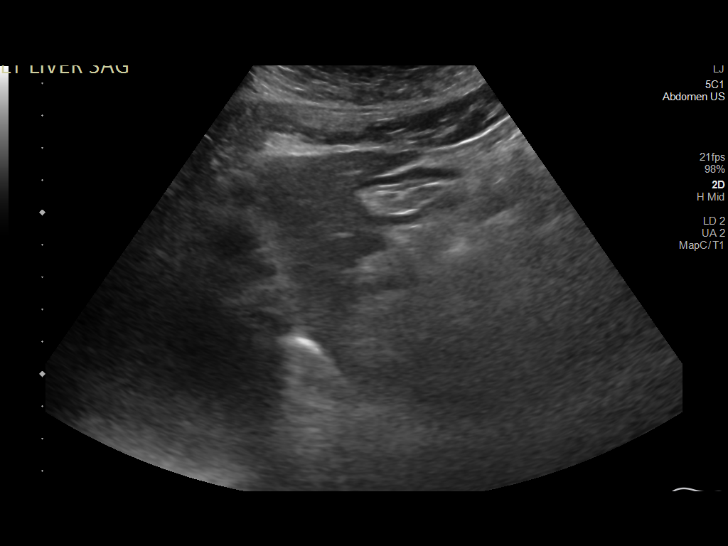
[im 43/57]
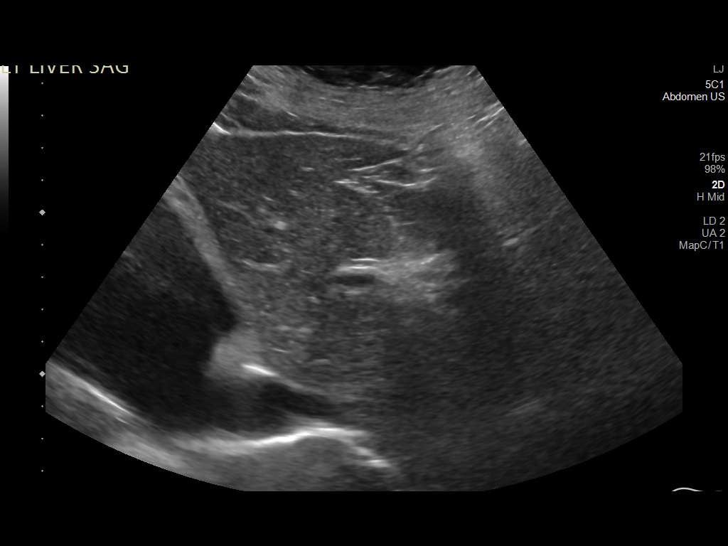
[im 47/57]
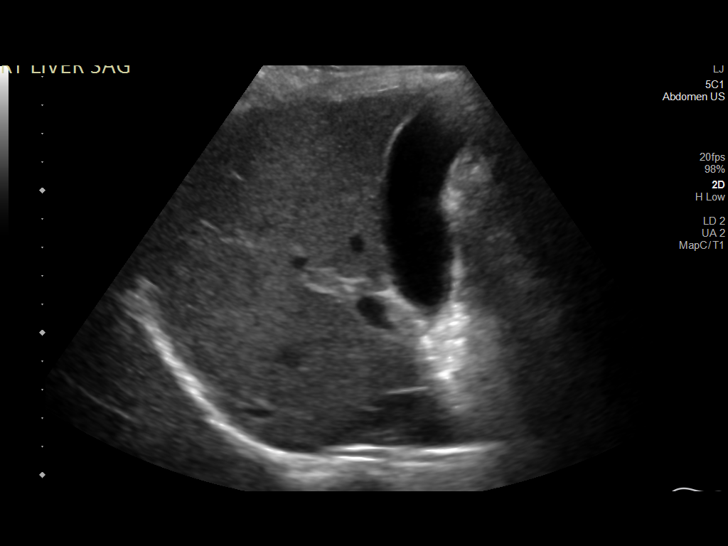
[im 52/57]
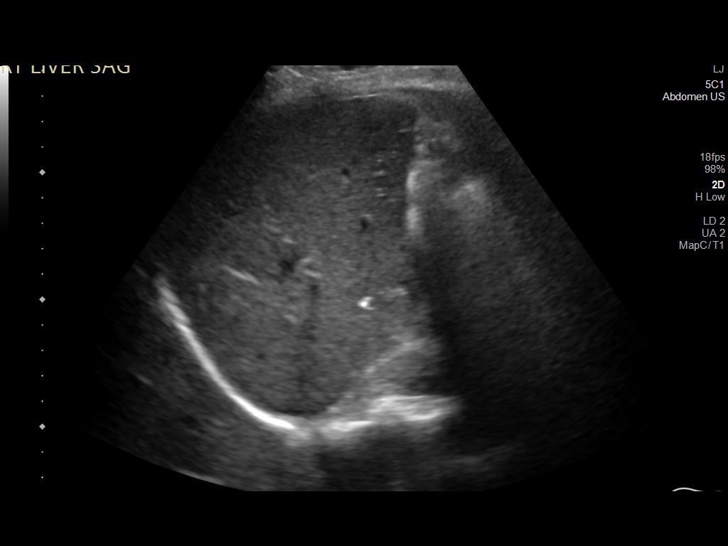
[im 57/57]
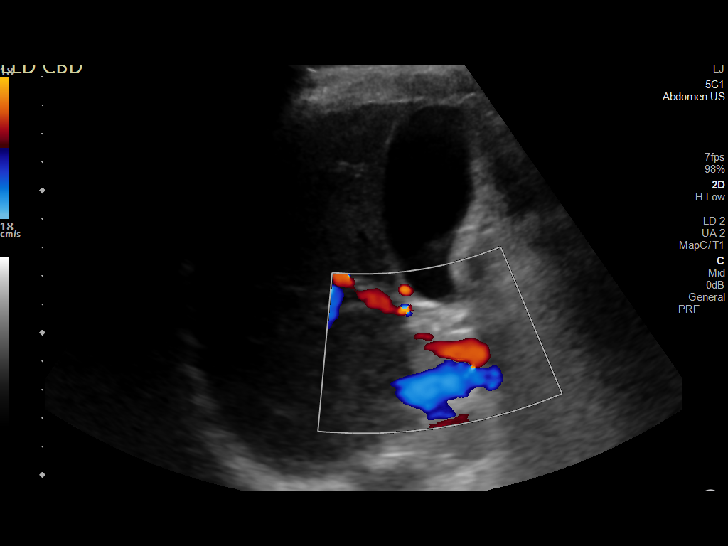

[14 of 25 positions shown; findings below may reference images not displayed]

FINDINGS: Gallbladder:

No gallstones or pericholecystic fluid visualized. No sonographic
Murphy sign noted by sonographer.

Common bile duct:

Diameter:

Liver:

No focal lesion identified. Diffusely increased parenchymal
echogenicity. Portal vein is patent on color Doppler imaging with
normal direction of blood flow towards the liver.

Other: None.
IMPRESSION: The echogenicity of the liver is increased. This is a nonspecific
finding but is most commonly seen with fatty infiltration of the
liver. There are no obvious focal liver lesions identified.

## 2021-10-19 DIAGNOSIS — Z13220 Encounter for screening for lipoid disorders: Secondary | ICD-10-CM | POA: Diagnosis not present

## 2021-10-19 DIAGNOSIS — Z1331 Encounter for screening for depression: Secondary | ICD-10-CM | POA: Diagnosis not present

## 2021-10-19 DIAGNOSIS — Z0000 Encounter for general adult medical examination without abnormal findings: Secondary | ICD-10-CM | POA: Diagnosis not present

## 2021-10-19 DIAGNOSIS — Z6834 Body mass index (BMI) 34.0-34.9, adult: Secondary | ICD-10-CM | POA: Diagnosis not present

## 2021-10-19 DIAGNOSIS — E559 Vitamin D deficiency, unspecified: Secondary | ICD-10-CM | POA: Diagnosis not present

## 2022-05-21 DIAGNOSIS — Z79899 Other long term (current) drug therapy: Secondary | ICD-10-CM | POA: Diagnosis not present

## 2022-05-21 DIAGNOSIS — R0602 Shortness of breath: Secondary | ICD-10-CM | POA: Diagnosis not present

## 2022-05-21 DIAGNOSIS — R200 Anesthesia of skin: Secondary | ICD-10-CM | POA: Diagnosis not present

## 2022-05-21 DIAGNOSIS — R519 Headache, unspecified: Secondary | ICD-10-CM | POA: Diagnosis not present

## 2022-05-21 DIAGNOSIS — R29818 Other symptoms and signs involving the nervous system: Secondary | ICD-10-CM | POA: Diagnosis not present

## 2022-05-21 DIAGNOSIS — R9431 Abnormal electrocardiogram [ECG] [EKG]: Secondary | ICD-10-CM | POA: Diagnosis not present

## 2022-05-21 DIAGNOSIS — F418 Other specified anxiety disorders: Secondary | ICD-10-CM | POA: Diagnosis not present

## 2022-05-21 DIAGNOSIS — G8194 Hemiplegia, unspecified affecting left nondominant side: Secondary | ICD-10-CM | POA: Diagnosis not present

## 2022-05-21 DIAGNOSIS — I639 Cerebral infarction, unspecified: Secondary | ICD-10-CM | POA: Diagnosis not present

## 2022-05-21 DIAGNOSIS — I6523 Occlusion and stenosis of bilateral carotid arteries: Secondary | ICD-10-CM | POA: Diagnosis not present

## 2022-05-21 DIAGNOSIS — R079 Chest pain, unspecified: Secondary | ICD-10-CM | POA: Diagnosis not present

## 2022-05-21 DIAGNOSIS — R531 Weakness: Secondary | ICD-10-CM | POA: Diagnosis not present

## 2022-05-22 DIAGNOSIS — R531 Weakness: Secondary | ICD-10-CM | POA: Diagnosis not present

## 2022-05-22 DIAGNOSIS — R519 Headache, unspecified: Secondary | ICD-10-CM | POA: Diagnosis not present

## 2022-05-22 DIAGNOSIS — F418 Other specified anxiety disorders: Secondary | ICD-10-CM | POA: Diagnosis not present

## 2022-05-22 DIAGNOSIS — I6389 Other cerebral infarction: Secondary | ICD-10-CM | POA: Diagnosis not present

## 2022-05-22 DIAGNOSIS — R079 Chest pain, unspecified: Secondary | ICD-10-CM | POA: Diagnosis not present

## 2022-05-22 DIAGNOSIS — R202 Paresthesia of skin: Secondary | ICD-10-CM | POA: Diagnosis not present

## 2022-05-30 DIAGNOSIS — Z6832 Body mass index (BMI) 32.0-32.9, adult: Secondary | ICD-10-CM | POA: Diagnosis not present

## 2022-05-30 DIAGNOSIS — R079 Chest pain, unspecified: Secondary | ICD-10-CM | POA: Diagnosis not present

## 2022-05-30 DIAGNOSIS — Z09 Encounter for follow-up examination after completed treatment for conditions other than malignant neoplasm: Secondary | ICD-10-CM | POA: Diagnosis not present

## 2022-06-07 DIAGNOSIS — R079 Chest pain, unspecified: Secondary | ICD-10-CM | POA: Diagnosis not present

## 2022-06-07 DIAGNOSIS — R000 Tachycardia, unspecified: Secondary | ICD-10-CM | POA: Diagnosis not present

## 2022-06-22 ENCOUNTER — Encounter: Payer: Self-pay | Admitting: *Deleted

## 2022-06-22 ENCOUNTER — Encounter: Payer: Self-pay | Admitting: Cardiology

## 2022-06-22 DIAGNOSIS — N920 Excessive and frequent menstruation with regular cycle: Secondary | ICD-10-CM

## 2022-06-22 DIAGNOSIS — K602 Anal fissure, unspecified: Secondary | ICD-10-CM | POA: Insufficient documentation

## 2022-06-22 DIAGNOSIS — L80 Vitiligo: Secondary | ICD-10-CM

## 2022-06-22 DIAGNOSIS — Z975 Presence of (intrauterine) contraceptive device: Secondary | ICD-10-CM | POA: Insufficient documentation

## 2022-06-22 DIAGNOSIS — R209 Unspecified disturbances of skin sensation: Secondary | ICD-10-CM | POA: Insufficient documentation

## 2022-06-22 DIAGNOSIS — N921 Excessive and frequent menstruation with irregular cycle: Secondary | ICD-10-CM

## 2022-06-22 DIAGNOSIS — G44209 Tension-type headache, unspecified, not intractable: Secondary | ICD-10-CM | POA: Insufficient documentation

## 2022-06-22 DIAGNOSIS — E669 Obesity, unspecified: Secondary | ICD-10-CM | POA: Insufficient documentation

## 2022-06-22 DIAGNOSIS — M533 Sacrococcygeal disorders, not elsewhere classified: Secondary | ICD-10-CM

## 2022-06-22 DIAGNOSIS — K644 Residual hemorrhoidal skin tags: Secondary | ICD-10-CM

## 2022-06-22 HISTORY — DX: Presence of (intrauterine) contraceptive device: Z97.5

## 2022-06-22 HISTORY — DX: Vitiligo: L80

## 2022-06-22 HISTORY — DX: Excessive and frequent menstruation with regular cycle: N92.0

## 2022-06-22 HISTORY — DX: Residual hemorrhoidal skin tags: K64.4

## 2022-06-22 HISTORY — DX: Excessive and frequent menstruation with irregular cycle: N92.1

## 2022-06-22 HISTORY — DX: Sacrococcygeal disorders, not elsewhere classified: M53.3

## 2022-06-22 HISTORY — DX: Unspecified disturbances of skin sensation: R20.9

## 2022-06-22 HISTORY — DX: Anal fissure, unspecified: K60.2

## 2022-06-22 HISTORY — DX: Obesity, unspecified: E66.9

## 2022-08-28 ENCOUNTER — Ambulatory Visit: Payer: BC Managed Care – PPO | Admitting: Cardiology

## 2022-09-05 DIAGNOSIS — Z124 Encounter for screening for malignant neoplasm of cervix: Secondary | ICD-10-CM | POA: Diagnosis not present

## 2022-09-05 DIAGNOSIS — Z1231 Encounter for screening mammogram for malignant neoplasm of breast: Secondary | ICD-10-CM | POA: Diagnosis not present

## 2022-09-05 DIAGNOSIS — Z6833 Body mass index (BMI) 33.0-33.9, adult: Secondary | ICD-10-CM | POA: Diagnosis not present

## 2022-09-05 DIAGNOSIS — Z01419 Encounter for gynecological examination (general) (routine) without abnormal findings: Secondary | ICD-10-CM | POA: Diagnosis not present

## 2022-09-05 DIAGNOSIS — Z1151 Encounter for screening for human papillomavirus (HPV): Secondary | ICD-10-CM | POA: Diagnosis not present

## 2022-10-24 DIAGNOSIS — E559 Vitamin D deficiency, unspecified: Secondary | ICD-10-CM | POA: Diagnosis not present

## 2022-10-24 DIAGNOSIS — Z1339 Encounter for screening examination for other mental health and behavioral disorders: Secondary | ICD-10-CM | POA: Diagnosis not present

## 2022-10-24 DIAGNOSIS — Z13220 Encounter for screening for lipoid disorders: Secondary | ICD-10-CM | POA: Diagnosis not present

## 2022-10-24 DIAGNOSIS — Z1331 Encounter for screening for depression: Secondary | ICD-10-CM | POA: Diagnosis not present

## 2022-10-24 DIAGNOSIS — Z6832 Body mass index (BMI) 32.0-32.9, adult: Secondary | ICD-10-CM | POA: Diagnosis not present

## 2022-10-24 DIAGNOSIS — Z0000 Encounter for general adult medical examination without abnormal findings: Secondary | ICD-10-CM | POA: Diagnosis not present

## 2023-11-12 DIAGNOSIS — Z6830 Body mass index (BMI) 30.0-30.9, adult: Secondary | ICD-10-CM | POA: Diagnosis not present

## 2023-11-12 DIAGNOSIS — Z1331 Encounter for screening for depression: Secondary | ICD-10-CM | POA: Diagnosis not present

## 2023-11-12 DIAGNOSIS — Z1339 Encounter for screening examination for other mental health and behavioral disorders: Secondary | ICD-10-CM | POA: Diagnosis not present

## 2023-11-12 DIAGNOSIS — Z0000 Encounter for general adult medical examination without abnormal findings: Secondary | ICD-10-CM | POA: Diagnosis not present

## 2023-11-12 DIAGNOSIS — Z13220 Encounter for screening for lipoid disorders: Secondary | ICD-10-CM | POA: Diagnosis not present

## 2023-11-12 DIAGNOSIS — Z131 Encounter for screening for diabetes mellitus: Secondary | ICD-10-CM | POA: Diagnosis not present

## 2023-11-13 DIAGNOSIS — E559 Vitamin D deficiency, unspecified: Secondary | ICD-10-CM | POA: Diagnosis not present

## 2024-01-22 ENCOUNTER — Encounter (HOSPITAL_BASED_OUTPATIENT_CLINIC_OR_DEPARTMENT_OTHER): Payer: Self-pay

## 2024-01-22 ENCOUNTER — Other Ambulatory Visit (HOSPITAL_BASED_OUTPATIENT_CLINIC_OR_DEPARTMENT_OTHER): Payer: Self-pay

## 2024-01-22 ENCOUNTER — Ambulatory Visit (HOSPITAL_BASED_OUTPATIENT_CLINIC_OR_DEPARTMENT_OTHER)
Admission: EM | Admit: 2024-01-22 | Discharge: 2024-01-22 | Disposition: A | Discharge status: Home / Self Care | Attending: Family Medicine | Admitting: Family Medicine

## 2024-01-22 DIAGNOSIS — J208 Acute bronchitis due to other specified organisms: Secondary | ICD-10-CM

## 2024-01-22 DIAGNOSIS — J019 Acute sinusitis, unspecified: Secondary | ICD-10-CM | POA: Diagnosis not present

## 2024-01-22 LAB — POC COVID19/FLU A&B COMBO
Covid Antigen, POC: NEGATIVE
Influenza A Antigen, POC: NEGATIVE
Influenza B Antigen, POC: NEGATIVE

## 2024-01-22 MED ORDER — COMPACT SPACE CHAMBER DEVI
0 refills | Status: AC
  Filled 2024-01-22: qty 1, 30d supply, fill #0

## 2024-01-22 MED ORDER — PREDNISONE 20 MG PO TABS
20.0000 mg | ORAL_TABLET | Freq: Every day | ORAL | 0 refills | Status: AC
  Filled 2024-01-22: qty 5, 5d supply, fill #0

## 2024-01-22 MED ORDER — PROMETHAZINE-DM 6.25-15 MG/5ML PO SYRP
5.0000 mL | ORAL_SOLUTION | Freq: Four times a day (QID) | ORAL | 0 refills | Status: AC | PRN
  Filled 2024-01-22: qty 118, 6d supply, fill #0

## 2024-01-22 MED FILL — Albuterol-Budesonide Inhalation Aerosol 90-80 MCG/ACT: 2.0000 | RESPIRATORY_TRACT | 120 days supply | Qty: 10.7 | Fill #0 | Status: CN

## 2024-01-22 MED FILL — Albuterol Sulfate Inhal Aero 108 MCG/ACT (90MCG Base Equiv): 2.0000 | RESPIRATORY_TRACT | 16 days supply | Qty: 6.7 | Fill #0 | Status: AC

## 2024-01-22 NOTE — Discharge Instructions (Addendum)
 Viral bronchitis and early sinusitis: Rapid flu and rapid COVID are negative.  Get plenty of fluids and rest.  Airsupra  inhaler, 2 puffs, every 4 hours as needed for cough or wheezing.  Use the inhaler with the spacer.  Prednisone  20 mg daily for 5 days.  Promethazine  DM, 5 mL, every 6 hours if needed for cough.  Follow-up here if symptoms do not improve, worsen or new symptoms occur.  Work excuse provided.  See below for signs and symptoms of worsening condition and reasons to go to an emergency room.  Get help right away if you: Cough up blood. Feel pain in your chest. Have severe shortness of breath. Faint or keep feeling like you are going to faint. Have a severe headache. Have a fever or chills that get worse. These symptoms may represent a serious problem that is an emergency. Do not wait to see if the symptoms will go away. Get medical help right away. Call your local emergency services (911 in the [place] Do not drive yourself to the hospital.  Traduccin de Google: Bronquitis viral y sinusitis incipiente: Las pruebas rpidas de gripe y COVID-19 dieron negativo. Beba muchos lquidos y descanse. Inhalador Airsupra , 2 inhalaciones cada 4 horas segn sea necesario para la tos o las sibilancias. Use el inhalador con engineer, civil (consulting). Prednisona 20 mg al da durante 5 das. Prometazina DM, 5 ml cada 6 horas si es necesario para la tos.  Acuda a consulta de seguimiento si los sntomas no mejoran, empeoran o aparecen nuevos sntomas. Se le proporcionar un justificante mdico. Consulte a continuacin los signos y sntomas de empeoramiento y los motivos para acudir a oceanographer.  Solicite ayuda de inmediato si: Tose con sangre. Siente dolor en el pecho. Tiene falta de aire grave. Se desmaya o se siente como si se fuera a desmayar. Tiene un dolor de cabeza intenso. Tiene fiebre o escalofros que empeoran. Estos sntomas pueden representar un problema grave que constituye radio broadcast assistant. No espere  a ver si los sntomas desaparecen. Solicite atencin mdica de inmediato. Comunquese con el servicio de emergencias de su localidad (911 en los [place]). No conduzca por sus propios medios [employer].

## 2024-01-22 NOTE — ED Triage Notes (Signed)
 Pt states she started to feel sick on Saturday and since then her symptoms have continued to get worst. Symptoms include cough, nasal congestion, HA, facial pain, upper back pain, chest tightness, chills, body aches, possible fever, and sore throat. She has taken tylenol, Advil, and Theraflu with temporary relief.

## 2024-01-22 NOTE — ED Provider Notes (Addendum)
 [facility]    CSN: [id number] Arrival date & time: 01/22/24  1320      History   Chief Complaint Chief Complaint  Patient presents with   Cough    HPI Lisa Rivas is a 48 y.o. female.   48 year old female here with complaint of symptoms that started on 01/18/2024.  She has had runny nose, cough, facial pain, fever, body aches, night sweats.  Symptoms seem to get better but then she started with fever and night sweats and cough again.  She has taken Tylenol, ibuprofen and TheraFlu with only minimal relief of symptoms.  Last night she had so much coughing and so much fever she could not sleep.   Cough Associated symptoms: fever, rhinorrhea, shortness of breath and wheezing   Associated symptoms: no chest pain, no chills, no ear pain, no rash and no sore throat     Past Medical History:  Diagnosis Date   Abnormal cervical Papanicolaou smear 03/05/2018   Abnormal uterine bleeding 03/05/2018   Acid reflux    Anal fissure 06/22/2022   Plan treat nitro cream and discussed diet change.  RTC 3-4 weeks to book for flexible sigmoidoscopy.  If no improvement will need EUA/sphincterotomy at [facility].   Arthritis 05/19/2018   Chondrocalcinosis    Coccydynia 06/22/2022   Disorder of coccyx 06/22/2022   Disturbance of skin sensation 06/22/2022   check labs; consider podiatry referral   External hemorrhoids 06/22/2022   Facial numbness    Fibroid uterus    h/o   GERD (gastroesophageal reflux disease)    History of Lyme disease    History of Lyme disease    Joint pain    Lyme arthritis (HCC)    Menorrhagia 06/22/2022   pt has persistent bleeding after IUD and now lower extremity complaints of muscle cramping; IUD is removed today; pt is noted to have significant pelvic cramps afterwards, no significant purulent discharge from OS or with IUD; she will f/u with me in one   Metrorrhagia 06/22/2022   first menses following IUD placement   Obesity 06/22/2022    Presence of intrauterine contraceptive device 06/22/2022   see below   Primary vitiligo 06/22/2022   Sciatica of right side 05/19/2018    Patient Active Problem List   Diagnosis Date Noted   Anal fissure 06/22/2022   Coccydynia 06/22/2022   Disorder of coccyx 06/22/2022   Disturbance of skin sensation 06/22/2022   External hemorrhoids 06/22/2022   Menorrhagia 06/22/2022   Metrorrhagia 06/22/2022   Obesity 06/22/2022   Presence of intrauterine contraceptive device 06/22/2022   Primary vitiligo 06/22/2022   Tension-type headache 06/22/2022   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      Home Medications    Prior to Admission medications  Medication Sig Start Date End Date Taking? Authorizing Provider  albuterol  (VENTOLIN  HFA) 108 (90 Base) MCG/ACT inhaler Inhale 2 puffs into the lungs every 4 (four) hours as needed for wheezing or shortness of breath. 01/22/24  Yes Ival Domino, FNP  predniSONE  (DELTASONE ) 20 MG tablet Take 1 tablet (20 mg total) by mouth daily with breakfast for 5 days. 01/22/24 01/27/24  Yes Ival Domino, FNP  promethazine -dextromethorphan (PROMETHAZINE -DM) 6.25-15 MG/5ML syrup Take 5 mLs by mouth 4 (four) times daily as needed for cough. Do not use and drive - May make drowsy. 01/22/24  Yes Ival Domino, FNP  Spacer/Aero-Holding Chambers (COMPACT SPACE CHAMBER) DEVI Use with the albuterol  inhaler 01/22/24  Yes Ival Domino, FNP  Cholecalciferol (D-3-5) 125 MCG (5000 UT) capsule Take 5,000 Units by mouth daily.    [provider]    Family History Family History  Problem Relation Age of Onset   Diabetes Maternal Grandmother    Diabetes Paternal  Grandmother    Colon cancer Neg Hx    Esophageal cancer Neg Hx    Stomach cancer Neg Hx    Rectal cancer Neg Hx     Social History Social History[1]   Allergies   Patient has no known allergies.   Review of Systems Review of Systems  Constitutional:  Positive for fever. Negative for chills.  HENT:  Positive for congestion, postnasal drip, rhinorrhea, sinus pressure and sinus pain. Negative for ear pain and sore throat.   Eyes:  Negative for pain and visual disturbance.  Respiratory:  Positive for cough, shortness of breath and wheezing.   Cardiovascular:  Negative for chest pain and palpitations.  Gastrointestinal:  Negative for abdominal pain, constipation, diarrhea, nausea and vomiting.  Genitourinary:  Negative for dysuria and hematuria.  Musculoskeletal:  Positive for arthralgias. Negative for back pain.  Skin:  Negative for color change and rash.  Neurological:  Negative for seizures and syncope.  All other systems reviewed and are negative.    Physical Exam Triage Vital Signs ED Triage Vitals  Encounter Vitals Group     BP 01/22/24 1353 (!) 144/92     Girls Systolic BP Percentile --      Girls Diastolic BP Percentile --      Boys Systolic BP Percentile --      Boys Diastolic BP Percentile --      Pulse Rate 01/22/24 1353 99     Resp 01/22/24 1353 20     Temp 01/22/24 1353 98.3 F (36.8 C)     Temp Source 01/22/24 1353 Oral     SpO2 01/22/24 1353 98 %     Weight --      Height --      Head Circumference --      Peak Flow --      Pain Score 01/22/24 1351 10     Pain Loc --      Pain Education --      Exclude from Growth Chart --    No data found.  Updated Vital Signs BP (!) 144/92 (BP Location: Right Arm)   Pulse 99   Temp 98.3 F (36.8 C) (Oral)   Resp 20   SpO2 98%   Visual Acuity Right Eye Distance:   Left Eye Distance:   Bilateral Distance:    Right Eye Near:   Left Eye Near:    Bilateral Near:     Physical Exam Vitals and nursing  note reviewed.  Constitutional:      General: She is not in acute distress.    Appearance: She is well-developed. She is ill-appearing. She is not toxic-appearing or diaphoretic.  HENT:     Head: Normocephalic and atraumatic.     Right Ear: Hearing, tympanic membrane, ear canal and external ear normal.     Left Ear: Hearing, tympanic membrane, ear canal and external ear normal.  Nose: Congestion and rhinorrhea present. Rhinorrhea is clear.     Right Sinus: Maxillary sinus tenderness and frontal sinus tenderness present.     Left Sinus: Maxillary sinus tenderness and frontal sinus tenderness present.     Mouth/Throat:     Lips: Pink.     Mouth: Mucous membranes are moist.     Pharynx: Uvula midline. Posterior oropharyngeal erythema present. No oropharyngeal exudate.     Tonsils: No tonsillar exudate (No erythema, enlargement, exudate).  Eyes:     Conjunctiva/sclera: Conjunctivae normal.     Pupils: Pupils are equal, round, and reactive to light.  Cardiovascular:     Rate and Rhythm: Normal rate and regular rhythm.     Heart sounds: S1 normal and S2 normal. No murmur heard. Pulmonary:     Effort: Pulmonary effort is normal. No respiratory distress.     Breath sounds: Examination of the right-upper field reveals wheezing. Examination of the left-upper field reveals wheezing. Wheezing (Rare inspiratory wheeze) present. No decreased breath sounds, rhonchi or rales.  Abdominal:     General: Bowel sounds are normal.     Palpations: Abdomen is soft.     Tenderness: There is no abdominal tenderness.  Musculoskeletal:        General: No swelling.     Cervical back: Neck supple.  Lymphadenopathy:     Head:     Right side of head: No submental, submandibular, tonsillar, preauricular or posterior auricular adenopathy.     Left side of head: No submental, submandibular, tonsillar, preauricular or posterior auricular adenopathy.     Cervical: Cervical adenopathy present.     Right cervical:  Superficial cervical adenopathy present.     Left cervical: Superficial cervical adenopathy present.  Skin:    General: Skin is warm and dry.     Capillary Refill: Capillary refill takes less than 2 seconds.     Findings: No rash.  Neurological:     Mental Status: She is alert and oriented to person, place, and time.  Psychiatric:        Mood and Affect: Mood normal.      UC Treatments / Results  Labs (all labs ordered are listed, but only abnormal results are displayed) Labs Reviewed  POC COVID19/FLU A&B COMBO - Normal    EKG   Radiology No results found.  Procedures Procedures (including critical care time)  Medications Ordered in UC Medications - No data to display  Initial Impression / Assessment and Plan / UC Course  I have reviewed the triage vital signs and the nursing notes.  Pertinent labs & imaging results that were available during my care of the patient were reviewed by me and considered in my medical decision making (see chart for details).  Plan of Care (see discharge instructions for additional patient precautions and education): Viral bronchitis and early sinusitis:  Rapid flu and rapid COVID are negative.   Get plenty of fluids and rest.   Airsupra  inhaler, 2 puffs, every 4 hours as needed for cough or wheezing.  Use the inhaler with the spacer.  Airsupra  is not covered on her insurance.  Albuterol  sent to the pharmacy. Prednisone  20 mg daily for 5 days.   Promethazine  DM, 5 mL, every 6 hours if needed for cough. Follow-up here if symptoms do not improve, worsen or new symptoms occur.   Work excuse provided.   See discharge instructions for signs and symptoms of worsening condition and reasons to go to an emergency room.  I reviewed  the plan of care with the patient and/or the patient's guardian.  The patient and/or guardian had time to ask questions and acknowledged that the questions were answered.  Final Clinical Impressions(s) / UC Diagnoses    Final diagnoses:  Acute viral bronchitis  Acute non-recurrent sinusitis, unspecified location     Discharge Instructions      Viral bronchitis and early sinusitis: Rapid flu and rapid COVID are negative.  Get plenty of fluids and rest.  Airsupra  inhaler, 2 puffs, every 4 hours as needed for cough or wheezing.  Use the inhaler with the spacer.  Prednisone  20 mg daily for 5 days.  Promethazine  DM, 5 mL, every 6 hours if needed for cough.  Follow-up here if symptoms do not improve, worsen or new symptoms occur.  Work excuse provided.  See below for signs and symptoms of worsening condition and reasons to go to an emergency room.  Get help right away if you: Cough up blood. Feel pain in your chest. Have severe shortness of breath. Faint or keep feeling like you are going to faint. Have a severe headache. Have a fever or chills that get worse. These symptoms may represent a serious problem that is an emergency. Do not wait to see if the symptoms will go away. Get medical help right away. Call your local emergency services (911 in the [place] Do not drive yourself to the hospital.  Traduccin de Google: Bronquitis viral y sinusitis incipiente: Las pruebas rpidas de gripe y COVID-19 dieron negativo. Beba muchos lquidos y descanse. Inhalador Airsupra , 2 inhalaciones cada 4 horas segn sea necesario para la tos o las sibilancias. Use el inhalador con engineer, civil (consulting). Prednisona 20 mg al da durante 5 das. Prometazina DM, 5 ml cada 6 horas si es necesario para la tos.  Acuda a consulta de seguimiento si los sntomas no mejoran, empeoran o aparecen nuevos sntomas. Se le proporcionar un justificante mdico. Consulte a continuacin los signos y sntomas de empeoramiento y los motivos para acudir a oceanographer.  Solicite ayuda de inmediato si: Tose con sangre. Siente dolor en el pecho. Tiene falta de aire grave. Se desmaya o se siente como si se fuera a desmayar. Tiene un dolor de cabeza  intenso. Tiene fiebre o escalofros que empeoran. Estos sntomas pueden representar un problema grave que constituye radio broadcast assistant. No espere a ver si los sntomas desaparecen. Solicite atencin mdica de inmediato. Comunquese con el servicio de emergencias de su localidad (911 en [place]). No conduzca por sus propios medios [facility].     ED Prescriptions     Medication Sig Dispense Auth. Provider   Albuterol -Budesonide  (AIRSUPRA ) 90-80 MCG/ACT AERO  (Status: Discontinued) Inhale 2 puffs into the lungs every 4 (four) hours as needed (wheezing.  Rinse mouth after use). 10.7 g Ival Domino, FNP   Spacer/Aero-Holding Chambers (COMPACT SPACE CHAMBER) DEVI Use with the albuterol  inhaler 1 each Ival Domino, FNP   predniSONE  (DELTASONE ) 20 MG tablet Take 1 tablet (20 mg total) by mouth daily with breakfast for 5 days. 5 tablet Flavius Repsher, FNP   promethazine -dextromethorphan (PROMETHAZINE -DM) 6.25-15 MG/5ML syrup Take 5 mLs by mouth 4 (four) times daily as needed for cough. Do not use and drive - May make drowsy. 118 mL Ival Domino, FNP   albuterol  (VENTOLIN  HFA) 108 (90 Base) MCG/ACT inhaler Inhale 2 puffs into the lungs every 4 (four) hours as needed for wheezing or shortness of breath. 1 each Ival Domino, FNP      PDMP  not reviewed this encounter.    Ival Domino, FNP 01/22/24 1438     [1]  Social History Tobacco Use   Smoking status: Never   Smokeless tobacco: Never  Vaping Use   Vaping status: Never Used  Substance Use Topics   Alcohol use: Not Currently   Drug use: Not Currently     Ival Domino, [facility] 01/22/24 1445
